# Patient Record
Sex: Male | Born: 1965 | Race: White | Hispanic: No | Marital: Single | Smoking: Current every day smoker
Health system: Southern US, Community
[De-identification: ages and names within clinical notes are randomized; demographics above are authoritative.]

## PROBLEM LIST (undated history)

## (undated) DIAGNOSIS — K56609 Unspecified intestinal obstruction, unspecified as to partial versus complete obstruction: Secondary | ICD-10-CM

## (undated) DIAGNOSIS — S069X9A Unspecified intracranial injury with loss of consciousness of unspecified duration, initial encounter: Secondary | ICD-10-CM

## (undated) DIAGNOSIS — K5652 Intestinal adhesions [bands] with complete obstruction: Secondary | ICD-10-CM

## (undated) DIAGNOSIS — F1010 Alcohol abuse, uncomplicated: Secondary | ICD-10-CM

## (undated) DIAGNOSIS — R569 Unspecified convulsions: Secondary | ICD-10-CM

## (undated) HISTORY — DX: Intestinal adhesions (bands) with complete obstruction: K56.52

## (undated) HISTORY — DX: Unspecified intestinal obstruction, unspecified as to partial versus complete obstruction: K56.609

---
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
## 2004-10-12 ENCOUNTER — Emergency Department: Payer: Self-pay | Admitting: Emergency Medicine

## 2005-01-03 ENCOUNTER — Emergency Department: Payer: Self-pay | Admitting: Emergency Medicine

## 2005-01-03 ENCOUNTER — Ambulatory Visit: Payer: Self-pay | Admitting: Emergency Medicine

## 2005-01-06 ENCOUNTER — Inpatient Hospital Stay: Payer: Self-pay | Admitting: General Surgery

## 2005-02-20 ENCOUNTER — Ambulatory Visit: Payer: Self-pay | Admitting: General Surgery

## 2006-07-01 ENCOUNTER — Ambulatory Visit: Payer: Self-pay | Admitting: Neurology

## 2007-07-03 ENCOUNTER — Emergency Department: Payer: Self-pay | Admitting: Unknown Physician Specialty

## 2018-01-29 ENCOUNTER — Inpatient Hospital Stay
Admission: EM | Admit: 2018-01-29 | Discharge: 2018-02-04 | DRG: 389 | Disposition: A | Discharge status: Home / Self Care | Payer: BLUE CROSS/BLUE SHIELD | Attending: Surgery | Admitting: Surgery

## 2018-01-29 ENCOUNTER — Emergency Department: Payer: BLUE CROSS/BLUE SHIELD

## 2018-01-29 ENCOUNTER — Encounter: Payer: Self-pay | Admitting: *Deleted

## 2018-01-29 ENCOUNTER — Other Ambulatory Visit: Payer: Self-pay

## 2018-01-29 DIAGNOSIS — N179 Acute kidney failure, unspecified: Secondary | ICD-10-CM | POA: Diagnosis present

## 2018-01-29 DIAGNOSIS — Z87820 Personal history of traumatic brain injury: Secondary | ICD-10-CM

## 2018-01-29 DIAGNOSIS — K56609 Unspecified intestinal obstruction, unspecified as to partial versus complete obstruction: Secondary | ICD-10-CM

## 2018-01-29 DIAGNOSIS — K565 Intestinal adhesions [bands], unspecified as to partial versus complete obstruction: Secondary | ICD-10-CM

## 2018-01-29 DIAGNOSIS — F17210 Nicotine dependence, cigarettes, uncomplicated: Secondary | ICD-10-CM | POA: Diagnosis present

## 2018-01-29 DIAGNOSIS — R933 Abnormal findings on diagnostic imaging of other parts of digestive tract: Secondary | ICD-10-CM | POA: Diagnosis not present

## 2018-01-29 DIAGNOSIS — K5652 Intestinal adhesions [bands] with complete obstruction: Secondary | ICD-10-CM

## 2018-01-29 DIAGNOSIS — K625 Hemorrhage of anus and rectum: Secondary | ICD-10-CM | POA: Diagnosis present

## 2018-01-29 DIAGNOSIS — Z978 Presence of other specified devices: Secondary | ICD-10-CM

## 2018-01-29 HISTORY — DX: Unspecified intestinal obstruction, unspecified as to partial versus complete obstruction: K56.609

## 2018-01-29 HISTORY — DX: Unspecified intracranial injury with loss of consciousness of unspecified duration, initial encounter: S06.9X9A

## 2018-01-29 LAB — COMPREHENSIVE METABOLIC PANEL WITH GFR
ALT: 27 U/L (ref 17–63)
AST: 34 U/L (ref 15–41)
Albumin: 5 g/dL (ref 3.5–5.0)
Alkaline Phosphatase: 95 U/L (ref 38–126)
Anion gap: 15 (ref 5–15)
BUN: 14 mg/dL (ref 6–20)
CO2: 26 mmol/L (ref 22–32)
Calcium: 9.9 mg/dL (ref 8.9–10.3)
Chloride: 99 mmol/L — ABNORMAL LOW (ref 101–111)
Creatinine, Ser: 0.93 mg/dL (ref 0.61–1.24)
GFR calc Af Amer: >60 mL/min
GFR calc non Af Amer: >60 mL/min
Glucose, Bld: 124 mg/dL — ABNORMAL HIGH (ref 65–99)
Potassium: 4.1 mmol/L (ref 3.5–5.1)
Sodium: 140 mmol/L (ref 135–145)
Total Bilirubin: 0.9 mg/dL (ref 0.3–1.2)
Total Protein: 9 g/dL — ABNORMAL HIGH (ref 6.5–8.1)

## 2018-01-29 LAB — URINALYSIS, COMPLETE (UACMP) WITH MICROSCOPIC
Bilirubin Urine: NEGATIVE
Glucose, UA: NEGATIVE mg/dL
Ketones, ur: NEGATIVE mg/dL
Leukocytes, UA: NEGATIVE
Nitrite: NEGATIVE
Protein, ur: 30 mg/dL — AB
Specific Gravity, Urine: 1.028 (ref 1.005–1.030)
Squamous Epithelial / HPF: NONE SEEN
pH: 6 (ref 5.0–8.0)

## 2018-01-29 LAB — CBC
HCT: 46.3 % (ref 40.0–52.0)
Hemoglobin: 15.3 g/dL (ref 13.0–18.0)
MCH: 30.3 pg (ref 26.0–34.0)
MCHC: 33 g/dL (ref 32.0–36.0)
MCV: 91.8 fL (ref 80.0–100.0)
Platelets: 309 K/uL (ref 150–440)
RBC: 5.04 MIL/uL (ref 4.40–5.90)
RDW: 14.3 % (ref 11.5–14.5)
WBC: 14 K/uL — ABNORMAL HIGH (ref 3.8–10.6)

## 2018-01-29 LAB — LIPASE, BLOOD: Lipase: 32 U/L (ref 11–51)

## 2018-01-29 MED ORDER — MORPHINE SULFATE (PF) 4 MG/ML IV SOLN
4.0000 mg | Freq: Once | INTRAVENOUS | Status: AC
  Administered 2018-01-29: 4 mg via INTRAVENOUS
  Filled 2018-01-29: qty 1

## 2018-01-29 MED ORDER — ENOXAPARIN SODIUM 40 MG/0.4ML ~~LOC~~ SOLN
40.0000 mg | SUBCUTANEOUS | Status: DC
  Administered 2018-01-29 – 2018-02-03 (×6): 40 mg via SUBCUTANEOUS
  Filled 2018-01-29 (×6): qty 0.4

## 2018-01-29 MED ORDER — ONDANSETRON HCL 4 MG/2ML IJ SOLN
4.0000 mg | Freq: Four times a day (QID) | INTRAMUSCULAR | Status: DC | PRN
  Administered 2018-01-30 – 2018-01-31 (×4): 4 mg via INTRAVENOUS
  Filled 2018-01-29 (×4): qty 2

## 2018-01-29 MED ORDER — MORPHINE SULFATE (PF) 2 MG/ML IV SOLN
2.0000 mg | INTRAVENOUS | Status: DC | PRN
  Administered 2018-01-30 – 2018-01-31 (×10): 2 mg via INTRAVENOUS
  Filled 2018-01-29 (×10): qty 1

## 2018-01-29 MED ORDER — PANTOPRAZOLE SODIUM 40 MG IV SOLR
40.0000 mg | Freq: Every day | INTRAVENOUS | Status: DC
  Administered 2018-01-29 – 2018-02-03 (×6): 40 mg via INTRAVENOUS
  Filled 2018-01-29 (×6): qty 40

## 2018-01-29 MED ORDER — IOPAMIDOL (ISOVUE-300) INJECTION 61%
100.0000 mL | Freq: Once | INTRAVENOUS | Status: AC | PRN
  Administered 2018-01-29: 100 mL via INTRAVENOUS

## 2018-01-29 MED ORDER — SODIUM CHLORIDE 0.9 % IV BOLUS (SEPSIS)
1000.0000 mL | Freq: Once | INTRAVENOUS | Status: AC
  Administered 2018-01-29: 1000 mL via INTRAVENOUS

## 2018-01-29 MED ORDER — PROMETHAZINE HCL 25 MG/ML IJ SOLN
12.5000 mg | Freq: Once | INTRAMUSCULAR | Status: AC
  Administered 2018-01-29: 12.5 mg via INTRAVENOUS
  Filled 2018-01-29: qty 1

## 2018-01-29 MED ORDER — IOPAMIDOL (ISOVUE-300) INJECTION 61%
30.0000 mL | Freq: Once | INTRAVENOUS | Status: AC | PRN
  Administered 2018-01-29: 30 mL via ORAL

## 2018-01-29 MED ORDER — KCL IN DEXTROSE-NACL 20-5-0.45 MEQ/L-%-% IV SOLN
INTRAVENOUS | Status: DC
  Administered 2018-01-29 – 2018-02-04 (×12): via INTRAVENOUS
  Filled 2018-01-29 (×20): qty 1000

## 2018-01-29 MED ORDER — ONDANSETRON HCL 4 MG/2ML IJ SOLN
4.0000 mg | Freq: Once | INTRAMUSCULAR | Status: AC
  Administered 2018-01-29: 4 mg via INTRAVENOUS
  Filled 2018-01-29: qty 2

## 2018-01-29 NOTE — Plan of Care (Signed)
  Progressing Education: Knowledge of General Education information will improve 01/29/2018 2123 - Progressing by Keonia Pasko, Alphonsus SiasKelly A, RN Pain Managment: General experience of comfort will improve 01/29/2018 2123 - Progressing by Adalea Handler, Alphonsus SiasKelly A, RN

## 2018-01-29 NOTE — ED Notes (Signed)
NG tube placed.

## 2018-01-29 NOTE — ED Provider Notes (Signed)
-----------------------------------------   4:28 PM on 01/29/2018 -----------------------------------------  I took over care on this patient from Dr. Alphonzo LemmingsMcShane.  Patient was pending CT abdomen to evaluate for possible SBO.  CT is in fact consistent with likely SBO.  I reassessed the patient, and he is reporting somewhat increased pain but appears relatively comfortable overall.  I consulted Dr. Earlene Plateravis from general surgery who recommends placing an NG tube, and he will come to evaluate the patient.  ----------------------------------------- 5:44 PM on 01/29/2018 -----------------------------------------  Dr. Earlene Plateravis has evaluated the patient, and will admit.   Dionne BucySiadecki, Lebaron Bautch, MD 01/29/18 1744
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
## 2018-01-29 NOTE — ED Notes (Signed)
Suction to low- intermittent on.
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
## 2018-01-29 NOTE — ED Triage Notes (Signed)
PT to ED reporting generalized abd pain with "bloating" that started last night at 20:00. Pain is cramping and nature. Pt reports nausea and vomiting with 6 episodes of emesis this morning. Pt also reports he has not had a BM in the past three days. No fevers reported.

## 2018-01-30 LAB — BASIC METABOLIC PANEL
ANION GAP: 11 (ref 5–15)
BUN: 11 mg/dL (ref 6–20)
CALCIUM: 8.9 mg/dL (ref 8.9–10.3)
CO2: 26 mmol/L (ref 22–32)
Chloride: 102 mmol/L (ref 101–111)
Creatinine, Ser: 0.93 mg/dL (ref 0.61–1.24)
Glucose, Bld: 120 mg/dL — ABNORMAL HIGH (ref 65–99)
Potassium: 3.6 mmol/L (ref 3.5–5.1)
Sodium: 139 mmol/L (ref 135–145)

## 2018-01-30 LAB — CBC
HCT: 40.8 % (ref 40.0–52.0)
HEMOGLOBIN: 13.8 g/dL (ref 13.0–18.0)
MCH: 31.2 pg (ref 26.0–34.0)
MCHC: 33.8 g/dL (ref 32.0–36.0)
MCV: 92.4 fL (ref 80.0–100.0)
Platelets: 248 10*3/uL (ref 150–440)
RBC: 4.41 MIL/uL (ref 4.40–5.90)
RDW: 14.3 % (ref 11.5–14.5)
WBC: 7.2 10*3/uL (ref 3.8–10.6)
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
## 2018-01-31 ENCOUNTER — Inpatient Hospital Stay: Payer: BLUE CROSS/BLUE SHIELD

## 2018-01-31 DIAGNOSIS — K56609 Unspecified intestinal obstruction, unspecified as to partial versus complete obstruction: Secondary | ICD-10-CM

## 2018-01-31 DIAGNOSIS — R933 Abnormal findings on diagnostic imaging of other parts of digestive tract: Secondary | ICD-10-CM

## 2018-01-31 LAB — BASIC METABOLIC PANEL
ANION GAP: 10 (ref 5–15)
BUN: 7 mg/dL (ref 6–20)
CALCIUM: 8.7 mg/dL — AB (ref 8.9–10.3)
CO2: 24 mmol/L (ref 22–32)
CREATININE: 0.68 mg/dL (ref 0.61–1.24)
Chloride: 101 mmol/L (ref 101–111)
Glucose, Bld: 118 mg/dL — ABNORMAL HIGH (ref 65–99)
Potassium: 3.7 mmol/L (ref 3.5–5.1)
Sodium: 135 mmol/L (ref 135–145)

## 2018-01-31 MED ORDER — KETOROLAC TROMETHAMINE 30 MG/ML IJ SOLN
30.0000 mg | Freq: Four times a day (QID) | INTRAMUSCULAR | Status: DC
  Administered 2018-01-31 – 2018-02-04 (×17): 30 mg via INTRAVENOUS
  Filled 2018-01-31 (×17): qty 1

## 2018-01-31 MED ORDER — PROMETHAZINE HCL 25 MG/ML IJ SOLN
12.5000 mg | Freq: Four times a day (QID) | INTRAMUSCULAR | Status: DC | PRN
  Administered 2018-01-31 – 2018-02-03 (×2): 12.5 mg via INTRAVENOUS
  Filled 2018-01-31 (×2): qty 1

## 2018-01-31 MED ORDER — DIATRIZOATE MEGLUMINE & SODIUM 66-10 % PO SOLN
90.0000 mL | Freq: Once | ORAL | Status: AC
  Administered 2018-01-31: 90 mL via NASOGASTRIC

## 2018-01-31 MED ORDER — PROCHLORPERAZINE EDISYLATE 5 MG/ML IJ SOLN
10.0000 mg | Freq: Four times a day (QID) | INTRAMUSCULAR | Status: DC | PRN
Start: 2018-01-31 — End: 2018-01-31
  Filled 2018-01-31: qty 2

## 2018-01-31 MED ORDER — HYDROMORPHONE HCL 1 MG/ML IJ SOLN
0.5000 mg | INTRAMUSCULAR | Status: DC | PRN
  Administered 2018-01-31 – 2018-02-02 (×8): 0.5 mg via INTRAVENOUS
  Filled 2018-01-31 (×9): qty 0.5

## 2018-01-31 NOTE — Progress Notes (Addendum)
01/31/2018  Subjective: Patient started having some more pain overnight.  Denies any nausea but does report feeling more bloated.  NG tube only had 350 out.  Vital signs: Temp:  [97.8 F (36.6 C)-98.5 F (36.9 C)] 97.8 F (36.6 C) (02/11 0624) Pulse Rate:  [81-89] 88 (02/11 0624) Resp:  [20] 20 (02/11 0624) BP: (109-130)/(71-81) 109/71 (02/11 0624) SpO2:  [98 %-99 %] 99 % (02/11 0624)   Intake/Output: 02/10 0701 - 02/11 0700 In: 1241 [I.V.:1241] Out: 980 [Urine:630; Emesis/NG output:350] Last BM Date: 01/29/18  Physical Exam: Constitutional: No acute distress Abdomen: Soft, mildly distended, with mild soreness to palpation in the mid abdomen.  No peritoneal signs.  Labs:  Recent Labs    01/29/18 1259 01/30/18 0448  WBC 14.0* 7.2  HGB 15.3 13.8  HCT 46.3 40.8  PLT 309 248   Recent Labs    01/30/18 0448 01/31/18 0438  NA 139 135  K 3.6 3.7  CL 102 101  CO2 26 24  GLUCOSE 120* 118*  BUN 11 7  CREATININE 0.93 0.68  CALCIUM 8.9 8.7*   No results for input(s): LABPROT, INR in the last 72 hours.  Imaging: No results found.  Assessment/Plan: 52 year old male with small bowel obstruction.  -We will obtain KUB this morning to evaluate his obstruction and possible issues with pain.  He may just need a larger caliber NG tube is the one that he has is likely a pediatric NG tube placed in the emergency department instead of a regular adult size -Change pain medications to start Toradol. -We will add Phenergan for nausea as well.   Mike IllJose Luis Torell Minder, MD [facility]
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
## 2018-01-31 NOTE — Progress Notes (Signed)
Patient complaining of nausea, too soon for prn Zofran. Dr Sheryle Hailiamond notified. He has ordered Compazine prn. Will monitor patient.

## 2018-02-01 ENCOUNTER — Inpatient Hospital Stay: Payer: BLUE CROSS/BLUE SHIELD

## 2018-02-01 LAB — BASIC METABOLIC PANEL
ANION GAP: 13 (ref 5–15)
BUN: 21 mg/dL — ABNORMAL HIGH (ref 6–20)
CALCIUM: 9.6 mg/dL (ref 8.9–10.3)
CHLORIDE: 95 mmol/L — AB (ref 101–111)
CO2: 26 mmol/L (ref 22–32)
Creatinine, Ser: 1.23 mg/dL (ref 0.61–1.24)
GFR calc Af Amer: >60 mL/min (ref >60)
GFR calc non Af Amer: >60 mL/min (ref >60)
Glucose, Bld: 115 mg/dL — ABNORMAL HIGH (ref 65–99)
POTASSIUM: 4 mmol/L (ref 3.5–5.1)
Sodium: 134 mmol/L — ABNORMAL LOW (ref 135–145)

## 2018-02-01 LAB — HIV ANTIBODY (ROUTINE TESTING W REFLEX): HIV SCREEN 4TH GENERATION: NONREACTIVE

## 2018-02-01 MED ORDER — PROCHLORPERAZINE EDISYLATE 5 MG/ML IJ SOLN
5.0000 mg | Freq: Four times a day (QID) | INTRAMUSCULAR | Status: DC | PRN
  Administered 2018-02-01: 5 mg via INTRAVENOUS
  Filled 2018-02-01 (×2): qty 1

## 2018-02-01 NOTE — Progress Notes (Signed)
02/01/2018  Subjective: Patient had nausea and episode of emesis last night despite of the NG tube that was in place.  This was a likely pediatric size NG tube as it was a very thin caliber.  He did have a Gastrografin challenge yesterday which she has tolerated well.  KUB this morning is still pending.  After the episode of emesis, the NG tube was exchanged for a 16 [place] NG tube and immediately 2 L plus of gastric contents were suctioned out.  The patient this morning reports that he is feeling significantly better with much improved pain and discomfort and distention.  Still no flatus or bowel movement.  However he does feel some gurgling in his abdomen.  Vital signs: Temp:  [98 F (36.7 C)-98.6 F (37 C)] 98.6 F (37 C) (02/12 0412) Pulse Rate:  [95-114] 114 (02/12 0412) Resp:  [16-22] 22 (02/12 0412) BP: (111-135)/(76-88) 111/85 (02/12 0412) SpO2:  [100 %] 100 % (02/12 0412)   Intake/Output: 02/11 0701 - 02/12 0700 In: 2560 [I.V.:2560] Out: 3825 [Urine:125; Emesis/NG output:3700] Last BM Date: 01/29/18  Physical Exam: Constitutional: No acute distress Abdomen: Softer, less distended, with no tenderness to palpation.  Midline scar well-healed consistent with prior laparotomy.  Labs:  Recent Labs    01/29/18 1259 01/30/18 0448  WBC 14.0* 7.2  HGB 15.3 13.8  HCT 46.3 40.8  PLT 309 248   Recent Labs    01/31/18 0438 02/01/18 0503  NA 135 134*  K 3.7 4.0  CL 101 95*  CO2 24 26  GLUCOSE 118* 115*  BUN 7 21*  CREATININE 0.68 1.23  CALCIUM 8.7* 9.6   No results for input(s): LABPROT, INR in the last 72 hours.  Imaging: Dg Abd 1 View  Result Date: 02/01/2018 CLINICAL DATA:  Encounter for orogastric tube placement. EXAM: ABDOMEN - 1 VIEW COMPARISON:  02/01/2018 CT 0119 hours FINDINGS: The tip and side port of a gastric tube are noted in the expected location of the stomach. Scattered air containing small large bowel loops are noted. No free air is identified. The  included heart and lung bases are nonacute. There is minimal atelectasis at the left lung base. No acute nor suspicious osseous abnormalities. IMPRESSION: Satisfactory gastric tube position in the left upper quadrant of the abdomen. Electronically Signed   By: Tollie Ethavid  Kwon M.D.   On: 02/01/2018 02:41   Dg Abd 1 View  Result Date: 02/01/2018 CLINICAL DATA:  52 y/o  M; nasogastric tube placement EXAM: ABDOMEN - 1 VIEW COMPARISON:  01/31/2018 abdomen radiographs FINDINGS: Nasogastric tube coils upon itself in the lower esophagus with tip projecting over the mid esophagus. Persistent mildly distended colon. Possible small right pleural effusion. Bones are unremarkable. IMPRESSION: Nasogastric tube coils in lower esophagus with tip projecting over mid esophagus, readjustment is recommended. These results will be called to the ordering clinician or representative by the Radiologist Assistant, and communication documented in the PACS or zVision Dashboard. Electronically Signed   By: Mitzi HansenLance  Furusawa-Stratton M.D.   On: 02/01/2018 01:42   Dg Abd 1 View  Result Date: 01/31/2018 CLINICAL DATA:  Persistent abdominal distention and nausea. Admitted 2 days ago with small bowel obstruction. EXAM: ABDOMEN - 1 VIEW COMPARISON:  Radiographs and CT 01/29/2018. FINDINGS: Nasogastric tube extends to the mid gastric body. Several mildly dilated loops small bowel again noted in the mid abdomen. There is slightly increased colonic gas. No evidence of free intraperitoneal air. Posttraumatic deformities of the left pelvic bones appear unchanged. IMPRESSION:  Slightly increased colonic gas with persistent asymmetric small bowel distension, consistent with improving partial small bowel obstruction. Electronically Signed   By: Carey Bullocks M.D.   On: 01/31/2018 10:19    Assessment/Plan: 52 year old male with small bowel obstruction.  -Patient feels now much better with the new NG tube that working bigger caliber.  I think at this  point probably we would reset the clock on his NG tube conservative management given that this tube is actually working.  KUB from Gastrografin challenge still pending. -Continue n.p.o., IV fluid hydration, and appropriate nausea pain control.   [name], MD [facility]

## 2018-02-01 NOTE — Progress Notes (Signed)
Patient complaining of nausea, had on episode of emesis. It is too soon for the next dose of Zofran and Phenergan. Notified Dr Everlene FarrierPabon and he has ordered Compazine 5mg  IV. Will administer and continue to monitor patient.

## 2018-02-01 NOTE — Plan of Care (Signed)
Pt continues to have output through the NG tube without complications of nausea. Pts pain has been minimal

## 2018-02-02 ENCOUNTER — Inpatient Hospital Stay: Payer: BLUE CROSS/BLUE SHIELD

## 2018-02-02 DIAGNOSIS — K5652 Intestinal adhesions [bands] with complete obstruction: Principal | ICD-10-CM

## 2018-02-02 LAB — CBC
HEMATOCRIT: 46.7 % (ref 40.0–52.0)
Hemoglobin: 15.6 g/dL (ref 13.0–18.0)
MCH: 30.6 pg (ref 26.0–34.0)
MCHC: 33.5 g/dL (ref 32.0–36.0)
MCV: 91.3 fL (ref 80.0–100.0)
PLATELETS: 304 10*3/uL (ref 150–440)
RBC: 5.12 MIL/uL (ref 4.40–5.90)
RDW: 13.8 % (ref 11.5–14.5)
WBC: 4.9 10*3/uL (ref 3.8–10.6)

## 2018-02-02 LAB — MAGNESIUM: MAGNESIUM: 2.2 mg/dL (ref 1.7–2.4)

## 2018-02-02 LAB — BASIC METABOLIC PANEL
ANION GAP: 14 (ref 5–15)
BUN: 41 mg/dL — ABNORMAL HIGH (ref 6–20)
CALCIUM: 8.9 mg/dL (ref 8.9–10.3)
CHLORIDE: 96 mmol/L — AB (ref 101–111)
CO2: 23 mmol/L (ref 22–32)
CREATININE: 1.73 mg/dL — AB (ref 0.61–1.24)
GFR calc non Af Amer: 44 mL/min — ABNORMAL LOW (ref >60)
GFR, EST AFRICAN AMERICAN: 51 mL/min — AB (ref >60)
Glucose, Bld: 118 mg/dL — ABNORMAL HIGH (ref 65–99)
Potassium: 3.9 mmol/L (ref 3.5–5.1)
Sodium: 133 mmol/L — ABNORMAL LOW (ref 135–145)
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
## 2018-02-02 NOTE — Plan of Care (Signed)
Pt is progressing. Output from NG has decreased. No nausea and minimal pain

## 2018-02-03 ENCOUNTER — Inpatient Hospital Stay: Payer: BLUE CROSS/BLUE SHIELD

## 2018-02-03 LAB — CBC
HCT: 38 % — ABNORMAL LOW (ref 40.0–52.0)
Hemoglobin: 12.7 g/dL — ABNORMAL LOW (ref 13.0–18.0)
MCH: 30.6 pg (ref 26.0–34.0)
MCHC: 33.3 g/dL (ref 32.0–36.0)
MCV: 91.9 fL (ref 80.0–100.0)
PLATELETS: 230 10*3/uL (ref 150–440)
RBC: 4.14 MIL/uL — ABNORMAL LOW (ref 4.40–5.90)
RDW: 13.7 % (ref 11.5–14.5)
WBC: 6.4 10*3/uL (ref 3.8–10.6)
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
## 2018-02-03 NOTE — Progress Notes (Signed)
  Afternoon rounds.  Patient tolerated clamp trial without any return of pain or nausea.  Minimal to no output when returned to suction.  Abdomen is benign.  NG tube removed per my order.  Discussed with the patient that he can have clear liquids but he is to sip on them to go slow.  Encourage ambulation and incentive spirometer use  Mike Frameharles Zarin Knupp, MD [facility] General Surgeon [facility]  Day [facility] [phone](7a-7p) [phone] Night ASCOM [phone](7p-7a) [phone]

## 2018-02-03 NOTE — Progress Notes (Signed)
NGT removed per MD order.
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
## 2018-02-03 NOTE — Plan of Care (Signed)
Pt has tolerated his NGT being clamped well. No C/O nausea/pain.

## 2018-02-03 NOTE — Progress Notes (Signed)
Melodie Bouillon, MD [address], [place], [place], [address] [address], [place], [place], [address] Phone: [phone]  Fax: [phone]   Subjective:  Pt. On NG clamp trial today and tolerating it well without any nausea vomiting.  Continuing to have some erections with no bowel movements.  Denies any abdominal pain.  Objective: Vital signs in last 24 hours: Vitals:   02/02/18 1315 02/02/18 2128 02/03/18 0618 02/03/18 1112  BP: 96/83 108/74 103/66   Pulse: 90 73 65   Resp: 20 18 20    Temp: 97.8 F (36.6 C) 97.7 F (36.5 C) (!) 97.5 F (36.4 C)   TempSrc: Oral Oral Oral   SpO2: 98% 100% 97%   Weight:    158 lb 4.6 oz (71.8 kg)  Height:       Weight change:   Intake/Output Summary (Last 24 hours) at 02/03/2018 1221 Last data filed at 02/03/2018 1153 Gross per 24 hour  Intake 2953 ml  Output 1105 ml  Net 1848 ml     Exam: Cardiac: +S1, +S2, RRR, No edema Pulm: CTA b/l, Normal Resp Effort Abd: Soft, NT/ND, No HSM Skin: Warm, no rashes Neck: Supple, Trachea midline   Lab Results: Lab Results  Component Value Date   WBC 6.4 02/03/2018   HGB 12.7 (L) 02/03/2018   HCT 38.0 (L) 02/03/2018   MCV 91.9 02/03/2018   PLT 230 02/03/2018   Micro Results: No results found for this or any previous visit (from the past 240 hour(s)). Studies/Results: Dg Abd Acute W/chest  Result Date: 02/02/2018 CLINICAL DATA:  Follow-up small bowel obstruction EXAM: DG ABDOMEN ACUTE W/ 1V CHEST COMPARISON:  02/01/2018 abdominal radiograph FINDINGS: Enteric tube terminates in the proximal stomach. Normal heart size. Normal mediastinal contour. No pneumothorax. No pleural effusion. Lungs appear clear, with no acute consolidative airspace disease and no pulmonary edema. There are persistent mildly to moderately dilated small bowel loops with air-fluid levels throughout the abdomen measuring up to 5.1 cm diameter, which appears slightly worsened in the interval. No  evidence of pneumatosis or pneumoperitoneum. No radiopaque nephrolithiasis. Retained cerclage wire overlies the left iliac bone medially. IMPRESSION: 1. No active disease in the chest. 2. Persistent mildly to moderately dilated small bowel loops with air-fluid levels throughout the abdomen, slightly worsened, compatible with persistent distal small bowel obstruction. Electronically Signed   By: Delbert Phenix M.D.   On: 02/02/2018 08:27   Dg Abd Portable 1v  Result Date: 02/03/2018 CLINICAL DATA:  Small-bowel obstruction secondary to adhesions. EXAM: PORTABLE ABDOMEN - 1 VIEW COMPARISON:  02/02/2018. FINDINGS: NG tube noted in stable position over the stomach. Surgical clips and wiring noted over the left abdomen. Multiple dilated loops of small bowel are again noted consistent with small bowel obstruction. Stool noted throughout the colon. No acute bony abnormality. IMPRESSION: 1.  NG tube noted over the stomach. 2. Multiple dilated loops of small bowel are noted again consistent with small bowel obstruction. Similar findings on prior exam. Electronically Signed   By: Maisie Fus  Register   On: 02/03/2018 05:48   Medications:  Scheduled Meds: . enoxaparin (LOVENOX) injection  40 mg Subcutaneous Q24H  . ketorolac  30 mg Intravenous Q6H  . pantoprazole (PROTONIX) IV  40 mg Intravenous QHS   Continuous Infusions: . dextrose 5 % and 0.45 % NaCl with KCl 20 mEq/L 125 mL/hr at 02/02/18 2329   PRN Meds:.HYDROmorphone (DILAUDID) injection, ondansetron (ZOFRAN) IV, prochlorperazine, promethazine   Assessment: Active Problems:   Small  bowel obstruction (HCC)    Plan: Surgical team managing patient for small bowel obstruction with NG tube, and on clamp trial today, and patient tolerating it well Once small bowel obstruction is clinically resolved, can schedule for colonoscopy to evaluate abnormal CT finding, and since he has never had a screening colonoscopy before Can schedule this as an  outpatient Follow-up in GI clinic on discharge at which time colonoscopy can be scheduled Continue to avoid constipating medications   LOS: 5 days   Melodie BouillonVarnita Kristy Catoe, MD 02/03/2018, 12:21 PM

## 2018-02-04 LAB — BASIC METABOLIC PANEL
ANION GAP: 8 (ref 5–15)
BUN: 9 mg/dL (ref 6–20)
CHLORIDE: 106 mmol/L (ref 101–111)
CO2: 21 mmol/L — AB (ref 22–32)
Calcium: 8.5 mg/dL — ABNORMAL LOW (ref 8.9–10.3)
Creatinine, Ser: 0.7 mg/dL (ref 0.61–1.24)
GFR calc Af Amer: >60 mL/min (ref >60)
GLUCOSE: 101 mg/dL — AB (ref 65–99)
POTASSIUM: 4.1 mmol/L (ref 3.5–5.1)
Sodium: 135 mmol/L (ref 135–145)

## 2018-02-04 NOTE — Progress Notes (Signed)
IV was removed. Discharge instructions and follow-up appointments were provided to the pt and father at bedside. All questions answered. The pt refused a wheelchair and walked downstairs with his father.

## 2018-02-04 NOTE — Discharge Summary (Signed)
Patient ID: Vivia EwingSteven D Stumpo MRN: [id number] DOB/AGE: [birth year] 52 y.o.  Admit date: 01/29/2018 Discharge date: 02/04/2018  Discharge Diagnoses:  Small bowel obstruction  Procedures Performed: None  Discharged Condition: good  Hospital Course: Patient admitted with a small bowel obstruction. Resolved with NGT decompression. ON day of discharge his abdomen was soft and nontender. He was tolerating a diet and having bowel function.  Discharge Orders: Discharge Instructions    Call MD for:  persistant nausea and vomiting   Complete by:  As directed    Call MD for:  severe uncontrolled pain   Complete by:  As directed    Call MD for:  temperature >100.4   Complete by:  As directed    Diet - low sodium heart healthy   Complete by:  As directed    Increase activity slowly   Complete by:  As directed       Disposition:   Discharge Medications: Allergies as of 02/04/2018   Not on File     Medication List    You have not been prescribed any medications.      Follwup: Follow-up Information    [facility] [place]. Schedule an appointment as soon as possible for a visit in 2 week(s).   Specialty:  General Surgery Why:  Hospital follow up Contact information: [address] [place] [phone]          Signed: Ricarda FrameCharles Jiles Goya 02/04/2018, 11:28 AM

## 2018-02-04 NOTE — Discharge Instructions (Signed)
Small Bowel Obstruction °A small bowel obstruction means that something is blocking the small bowel. The small bowel is also called the small intestine. It is the long tube that connects the stomach to the colon. An obstruction will stop food and fluids from passing through the small bowel. Treatment depends on what is causing the problem and how bad the problem is. °Follow these instructions at home: °· Get a lot of rest. °· Follow your diet as told by your doctor. You may need to: °? Only drink clear liquids until you start to get better. °? Avoid solid foods as told by your doctor. °· Take over-the-counter and prescription medicines only as told by your doctor. °· Keep all follow-up visits as told by your doctor. This is important. °Contact a doctor if: °· You have a fever. °· You have chills. °Get help right away if: °· You have pain or cramps that get worse. °· You throw up (vomit) blood. °· You have a feeling of being sick to your stomach (nausea) that does not go away. °· You cannot stop throwing up. °· You cannot drink fluids. °· You feel confused. °· You feel dry or thirsty (dehydrated). °· Your belly gets more bloated. °· You feel weak or you pass out (faint). °This information is not intended to replace advice given to you by your health care provider. Make sure you discuss any questions you have with your health care provider. °Document Released: 01/14/2005 Document Revised: 08/03/2016 Document Reviewed: 01/31/2015 °Elsevier Interactive Patient Education © 2018 [employer] ° °

## 2018-02-07 ENCOUNTER — Telehealth: Payer: Self-pay | Admitting: General Practice

## 2018-02-07 NOTE — Telephone Encounter (Signed)
Patient came by the office and dropped off some fmla paperwork, payment has been paid and paperwork placed in folder up front.

## 2018-02-08 DIAGNOSIS — K5652 Intestinal adhesions [bands] with complete obstruction: Secondary | ICD-10-CM

## 2018-02-09 NOTE — Telephone Encounter (Signed)
Patient came by and picked up his copy.

## 2018-02-14 ENCOUNTER — Telehealth: Payer: Self-pay | Admitting: Gastroenterology

## 2018-02-14 ENCOUNTER — Encounter: Payer: Self-pay | Admitting: Gastroenterology

## 2018-02-14 NOTE — Telephone Encounter (Signed)
LVM for patient to call and schedule a one week follow up with Dr. Maximino Greenlandahiliani

## 2018-02-14 NOTE — Telephone Encounter (Signed)
Letter sent for patient to call and schedule a 1 week follow up with Dr. Maximino Greenlandahiliani.

## 2018-02-16 ENCOUNTER — Emergency Department: Payer: BLUE CROSS/BLUE SHIELD

## 2018-02-16 ENCOUNTER — Other Ambulatory Visit: Payer: Self-pay

## 2018-02-16 ENCOUNTER — Inpatient Hospital Stay: Payer: BLUE CROSS/BLUE SHIELD

## 2018-02-16 ENCOUNTER — Inpatient Hospital Stay
Admission: EM | Admit: 2018-02-16 | Discharge: 2018-02-26 | DRG: 330 | Disposition: A | Discharge status: Home Health | Payer: BLUE CROSS/BLUE SHIELD | Attending: General Surgery | Admitting: General Surgery

## 2018-02-16 DIAGNOSIS — K660 Peritoneal adhesions (postprocedural) (postinfection): Principal | ICD-10-CM | POA: Diagnosis present

## 2018-02-16 DIAGNOSIS — K9189 Other postprocedural complications and disorders of digestive system: Secondary | ICD-10-CM

## 2018-02-16 DIAGNOSIS — R000 Tachycardia, unspecified: Secondary | ICD-10-CM | POA: Diagnosis present

## 2018-02-16 DIAGNOSIS — K219 Gastro-esophageal reflux disease without esophagitis: Secondary | ICD-10-CM | POA: Diagnosis present

## 2018-02-16 DIAGNOSIS — K567 Ileus, unspecified: Secondary | ICD-10-CM

## 2018-02-16 DIAGNOSIS — Z0189 Encounter for other specified special examinations: Secondary | ICD-10-CM

## 2018-02-16 DIAGNOSIS — E860 Dehydration: Secondary | ICD-10-CM | POA: Diagnosis present

## 2018-02-16 DIAGNOSIS — F17210 Nicotine dependence, cigarettes, uncomplicated: Secondary | ICD-10-CM | POA: Diagnosis present

## 2018-02-16 DIAGNOSIS — Z87820 Personal history of traumatic brain injury: Secondary | ICD-10-CM

## 2018-02-16 DIAGNOSIS — T8149XA Infection following a procedure, other surgical site, initial encounter: Secondary | ICD-10-CM | POA: Diagnosis not present

## 2018-02-16 DIAGNOSIS — K56609 Unspecified intestinal obstruction, unspecified as to partial versus complete obstruction: Secondary | ICD-10-CM | POA: Diagnosis present

## 2018-02-16 LAB — COMPREHENSIVE METABOLIC PANEL
ALT: 25 U/L (ref 17–63)
ANION GAP: 10 (ref 5–15)
AST: 33 U/L (ref 15–41)
Albumin: 4 g/dL (ref 3.5–5.0)
Alkaline Phosphatase: 71 U/L (ref 38–126)
BUN: 10 mg/dL (ref 6–20)
CALCIUM: 9.3 mg/dL (ref 8.9–10.3)
CHLORIDE: 106 mmol/L (ref 101–111)
CO2: 23 mmol/L (ref 22–32)
Creatinine, Ser: 0.86 mg/dL (ref 0.61–1.24)
GFR calc Af Amer: >60 mL/min (ref >60)
Glucose, Bld: 122 mg/dL — ABNORMAL HIGH (ref 65–99)
Potassium: 3.9 mmol/L (ref 3.5–5.1)
SODIUM: 139 mmol/L (ref 135–145)
Total Bilirubin: 0.8 mg/dL (ref 0.3–1.2)
Total Protein: 7.5 g/dL (ref 6.5–8.1)

## 2018-02-16 LAB — CBC WITH DIFFERENTIAL/PLATELET
Basophils Absolute: 0.1 10*3/uL (ref 0–0.1)
Basophils Relative: 1 %
EOS ABS: 0 10*3/uL (ref 0–0.7)
EOS PCT: 0 %
HCT: 43.8 % (ref 40.0–52.0)
Hemoglobin: 14.4 g/dL (ref 13.0–18.0)
LYMPHS ABS: 0.8 10*3/uL — AB (ref 1.0–3.6)
Lymphocytes Relative: 6 %
MCH: 29.9 pg (ref 26.0–34.0)
MCHC: 32.8 g/dL (ref 32.0–36.0)
MCV: 91 fL (ref 80.0–100.0)
MONO ABS: 0.6 10*3/uL (ref 0.2–1.0)
MONOS PCT: 4 %
Neutro Abs: 12.3 10*3/uL — ABNORMAL HIGH (ref 1.4–6.5)
Neutrophils Relative %: 89 %
PLATELETS: 438 10*3/uL (ref 150–440)
RBC: 4.82 MIL/uL (ref 4.40–5.90)
RDW: 14.3 % (ref 11.5–14.5)
WBC: 13.9 10*3/uL — ABNORMAL HIGH (ref 3.8–10.6)

## 2018-02-16 LAB — LIPASE, BLOOD: LIPASE: 28 U/L (ref 11–51)

## 2018-02-16 MED ORDER — DEXTROSE IN LACTATED RINGERS 5 % IV SOLN
INTRAVENOUS | Status: DC
  Administered 2018-02-16 – 2018-02-17 (×5): via INTRAVENOUS

## 2018-02-16 MED ORDER — PROMETHAZINE HCL 25 MG/ML IJ SOLN
12.5000 mg | Freq: Four times a day (QID) | INTRAMUSCULAR | Status: DC | PRN
  Administered 2018-02-16 – 2018-02-25 (×3): 12.5 mg via INTRAVENOUS
  Filled 2018-02-16 (×3): qty 1

## 2018-02-16 MED ORDER — PANTOPRAZOLE SODIUM 40 MG IV SOLR
40.0000 mg | Freq: Every day | INTRAVENOUS | Status: DC
  Administered 2018-02-16 – 2018-02-24 (×8): 40 mg via INTRAVENOUS
  Filled 2018-02-16 (×9): qty 40

## 2018-02-16 MED ORDER — ONDANSETRON HCL 4 MG/2ML IJ SOLN
INTRAMUSCULAR | Status: AC
  Administered 2018-02-16: 4 mg via INTRAVENOUS
  Filled 2018-02-16: qty 2

## 2018-02-16 MED ORDER — HYDROMORPHONE HCL 1 MG/ML IJ SOLN
0.5000 mg | INTRAMUSCULAR | Status: DC | PRN
Start: 2018-02-16 — End: 2018-02-17
  Administered 2018-02-17 (×3): 0.5 mg via INTRAVENOUS
  Filled 2018-02-16 (×3): qty 0.5

## 2018-02-16 MED ORDER — ONDANSETRON 4 MG PO TBDP: 4.0000 mg | ORAL_TABLET | Freq: Four times a day (QID) | ORAL | Status: DC | PRN

## 2018-02-16 MED ORDER — SODIUM CHLORIDE 0.9 % IV BOLUS (SEPSIS)
1000.0000 mL | INTRAVENOUS | Status: AC
  Administered 2018-02-16: 1000 mL via INTRAVENOUS

## 2018-02-16 MED ORDER — ONDANSETRON HCL 4 MG/2ML IJ SOLN
4.0000 mg | Freq: Once | INTRAMUSCULAR | Status: AC
  Administered 2018-02-16: 4 mg via INTRAVENOUS

## 2018-02-16 MED ORDER — DIPHENHYDRAMINE HCL 25 MG PO CAPS: 25.0000 mg | ORAL_CAPSULE | Freq: Four times a day (QID) | ORAL | Status: DC | PRN

## 2018-02-16 MED ORDER — ONDANSETRON HCL 4 MG/2ML IJ SOLN
4.0000 mg | Freq: Four times a day (QID) | INTRAMUSCULAR | Status: DC | PRN
  Administered 2018-02-16: 4 mg via INTRAVENOUS
  Filled 2018-02-16: qty 2

## 2018-02-16 MED ORDER — LIDOCAINE HCL 2 % EX GEL
CUTANEOUS | Status: AC
  Filled 2018-02-16: qty 10

## 2018-02-16 MED ORDER — ONDANSETRON HCL 4 MG/2ML IJ SOLN
4.0000 mg | INTRAMUSCULAR | Status: AC
  Administered 2018-02-16: 4 mg via INTRAVENOUS
  Filled 2018-02-16: qty 2

## 2018-02-16 MED ORDER — INFLUENZA VAC SPLIT QUAD 0.5 ML IM SUSY
0.5000 mL | PREFILLED_SYRINGE | INTRAMUSCULAR | Status: DC
  Filled 2018-02-16: qty 0.5

## 2018-02-16 MED ORDER — DIPHENHYDRAMINE HCL 50 MG/ML IJ SOLN: 25.0000 mg | Freq: Four times a day (QID) | INTRAMUSCULAR | Status: DC | PRN

## 2018-02-16 MED ORDER — MORPHINE SULFATE (PF) 4 MG/ML IV SOLN
4.0000 mg | INTRAVENOUS | Status: DC | PRN
  Administered 2018-02-16: 4 mg via INTRAVENOUS
  Filled 2018-02-16: qty 1

## 2018-02-16 MED ORDER — HYDRALAZINE HCL 20 MG/ML IJ SOLN: 10.0000 mg | INTRAMUSCULAR | Status: DC | PRN

## 2018-02-16 MED ORDER — KETOROLAC TROMETHAMINE 30 MG/ML IJ SOLN
30.0000 mg | Freq: Four times a day (QID) | INTRAMUSCULAR | Status: AC
  Administered 2018-02-16 – 2018-02-21 (×19): 30 mg via INTRAVENOUS
  Filled 2018-02-16 (×19): qty 1

## 2018-02-16 MED ORDER — MORPHINE SULFATE (PF) 4 MG/ML IV SOLN
4.0000 mg | Freq: Once | INTRAVENOUS | Status: AC
  Administered 2018-02-16: 4 mg via INTRAVENOUS
  Filled 2018-02-16: qty 1

## 2018-02-16 MED ORDER — IOPAMIDOL (ISOVUE-300) INJECTION 61%
100.0000 mL | Freq: Once | INTRAVENOUS | Status: AC | PRN
  Administered 2018-02-16: 100 mL via INTRAVENOUS

## 2018-02-16 NOTE — ED Triage Notes (Signed)
Pt in with co generalized abd pain since 2030 with vomiting since 0130. Pt has recent admission for bowel obstruction states feels the same.
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
## 2018-02-16 NOTE — ED Notes (Signed)
Patient has had approx drainage into suction canister, continuing to drain.

## 2018-02-16 NOTE — ED Notes (Signed)
This tech to transport patient to room 224. Pt belongings (shirt and coat) bagged and placed on bed with pt.

## 2018-02-16 NOTE — ED Notes (Signed)
Report given to Megan, RN.

## 2018-02-16 NOTE — ED Notes (Signed)
This RN to bedside to introduce self to patient. Pt resting in bed alert and oriented. NAD noted at this time. NG tube remains hooked up to low intermittent suction at this time. Pt denies any further needs. Will continue to monitor for further patient needs.
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
## 2018-02-16 NOTE — Progress Notes (Signed)
02/16/2018  Subjective: Patient admitted this morning by Dr. Tonita Cong for recurrent small bowel obstruction.  He was hospitalized for same on 2/9 to 2/15.  Feels better with NG tube in place and reports feeling less pressure.  Vital signs: Temp:  [97.4 F (36.3 C)-97.8 F (36.6 C)] 97.8 F (36.6 C) (02/27 0810) Pulse Rate:  [76-91] 84 (02/27 0810) Resp:  [12-18] 12 (02/27 0810) BP: (128-138)/(85-93) 137/85 (02/27 0810) SpO2:  [98 %-100 %] 100 % (02/27 0810) Weight:  [70.5 kg (155 lb 6.8 oz)-81.6 kg (180 lb)] 70.5 kg (155 lb 6.8 oz) (02/27 0844)   Intake/Output: No intake/output data recorded.    Physical Exam: Constitutional: No acute distress Abdomen:  Soft, mildly distended, with tenderness to palpation in the midabdomen, more around his midline incision.  Labs:  Recent Labs    02/16/18 0528  WBC 13.9*  HGB 14.4  HCT 43.8  PLT 438   Recent Labs    02/16/18 0528  NA 139  K 3.9  CL 106  CO2 23  GLUCOSE 122*  BUN 10  CREATININE 0.86  CALCIUM 9.3   No results for input(s): LABPROT, INR in the last 72 hours.  Imaging: Ct Abdomen Pelvis W Contrast  Result Date: 02/16/2018 CLINICAL DATA:  52 year old male with nausea vomiting. History of bowel obstruction and prior exploratory laparotomy. EXAM: CT ABDOMEN AND PELVIS WITH CONTRAST TECHNIQUE: Multidetector CT imaging of the abdomen and pelvis was performed using the standard protocol following bolus administration of intravenous contrast. CONTRAST:  ISOVUE-300 IOPAMIDOL (ISOVUE-300) INJECTION 61% COMPARISON:  Abdominal CT dated 01/29/2018 FINDINGS: Lower chest: Clusters of nodular density in the lower lobes bilaterally, new compared to the prior CT most likely infectious or inflammatory. Aspiration is not excluded. Clinical correlation is recommended. No intra-abdominal free air. Small amount of fluid within the pelvis and adjacent to the liver. Hepatobiliary: No focal liver abnormality is seen. No gallstones,  gallbladder wall thickening, or biliary dilatation. Pancreas: Unremarkable. No pancreatic ductal dilatation or surrounding inflammatory changes. Spleen: Normal in size without focal abnormality. Adrenals/Urinary Tract: Adrenal glands are unremarkable. Kidneys are normal, without renal calculi, focal lesion, or hydronephrosis. Bladder is unremarkable. Stomach/Bowel: An enteric tube is seen with tip in the gastric fundus. There is a small hiatal hernia. There is dilatation of multiple loops of small bowel in the proximal and mid abdomen measuring up to 3.3 cm in diameter. The distal small bowel demonstrate a normal caliber. A transition zone is seen in the anterior lower pelvis (series 2, image 68 and coronal series 5 image 25). There is abutment of loops of small bowel to the anterior peritoneal wall and rectus sheath most consistent with adhesions. There is mild associated inflammatory changes of the bowel loops in the left lower abdomen. The colon is unremarkable. An air-filled diverticula or prominent colonic fold arising from the sigmoid colon corresponds to the location of the previously seen ovoid lesion. The appendix is normal. Vascular/Lymphatic: The abdominal aorta and IVC appear unremarkable. No portal venous gas. There is no adenopathy. Reproductive: The prostate and seminal vesicles are grossly unremarkable. Other: Midline vertical anterior abdominal wall incisional scar. Musculoskeletal: Old healed fracture of the left pelvic bone with partial ankylosis of the left SI joint. No acute osseous pathology. IMPRESSION: Small-bowel obstruction secondary to adhesions with transition in the left anterior lower pelvis posterior to the left rectus sheath. Overall there is progression of the obstruction compared to the prior CT of 01/29/2018. Electronically Signed   By: Elgie Collard  M.D.   On: 02/16/2018 06:48   Dg Abd Portable 1 View  Result Date: 02/16/2018 CLINICAL DATA:  52 y/o  M; enteric tube  placement. EXAM: PORTABLE ABDOMEN - 1 VIEW COMPARISON:  02/03/2018 abdomen radiographs. FINDINGS: Normal bowel gas pattern. Enteric tube tip projects over gastric body. Bones are unremarkable. IMPRESSION: Enteric tube tip projects over gastric body. Electronically Signed   By: Mitzi HansenLance  Furusawa-Stratton M.D.   On: 02/16/2018 06:06    Assessment/Plan: 52 yo male with small bowel obstruction  --discussed with patient that we would start conservative management for now with NPO and NG tube to suction as planned and discussed with Dr. Tonita CongWoodham.  Concern is that given his quick recurrence, he may need surgery.  Will see how he progresses over the first 24 hours, with lower threshold to take to OR tomorrow if no improvement.  Patient understands this plan and all his questions have been answered.   Howie IllJose Luis Harrol Novello, MD [facility]

## 2018-02-17 ENCOUNTER — Encounter
Admission: EM | Disposition: A | Discharge status: Home Health | Payer: Self-pay | Source: Home / Self Care | Attending: General Surgery

## 2018-02-17 ENCOUNTER — Encounter: Payer: Self-pay | Admitting: Anesthesiology

## 2018-02-17 ENCOUNTER — Inpatient Hospital Stay: Payer: BLUE CROSS/BLUE SHIELD | Admitting: Anesthesiology

## 2018-02-17 ENCOUNTER — Inpatient Hospital Stay: Payer: BLUE CROSS/BLUE SHIELD | Admitting: Surgery

## 2018-02-17 DIAGNOSIS — K56609 Unspecified intestinal obstruction, unspecified as to partial versus complete obstruction: Secondary | ICD-10-CM

## 2018-02-17 HISTORY — PX: LAPAROTOMY: SHX154

## 2018-02-17 HISTORY — PX: LYSIS OF ADHESION: SHX5961

## 2018-02-17 HISTORY — PX: BOWEL RESECTION: SHX1257

## 2018-02-17 LAB — CBC
HEMATOCRIT: 37.7 % — AB (ref 40.0–52.0)
HEMOGLOBIN: 12.6 g/dL — AB (ref 13.0–18.0)
MCH: 30.5 pg (ref 26.0–34.0)
MCHC: 33.5 g/dL (ref 32.0–36.0)
MCV: 91.1 fL (ref 80.0–100.0)
Platelets: 346 10*3/uL (ref 150–440)
RBC: 4.14 MIL/uL — AB (ref 4.40–5.90)
RDW: 14.1 % (ref 11.5–14.5)
WBC: 6.5 10*3/uL (ref 3.8–10.6)

## 2018-02-17 LAB — PROTIME-INR
INR: 0.93
PROTHROMBIN TIME: 12.4 s (ref 11.4–15.2)

## 2018-02-17 LAB — APTT: aPTT: 31 seconds (ref 24–36)

## 2018-02-17 LAB — BASIC METABOLIC PANEL
ANION GAP: 7 (ref 5–15)
BUN: 6 mg/dL (ref 6–20)
CALCIUM: 8.4 mg/dL — AB (ref 8.9–10.3)
CO2: 25 mmol/L (ref 22–32)
CREATININE: 0.76 mg/dL (ref 0.61–1.24)
Chloride: 103 mmol/L (ref 101–111)
Glucose, Bld: 104 mg/dL — ABNORMAL HIGH (ref 65–99)
Potassium: 3.8 mmol/L (ref 3.5–5.1)
SODIUM: 135 mmol/L (ref 135–145)

## 2018-02-17 LAB — MAGNESIUM: MAGNESIUM: 1.9 mg/dL (ref 1.7–2.4)

## 2018-02-17 SURGERY — LAPAROTOMY, EXPLORATORY
Anesthesia: General | Site: Abdomen | Wound class: Contaminated

## 2018-02-17 MED ORDER — SODIUM CHLORIDE 0.9 % IJ SOLN
INTRAMUSCULAR | Status: AC
  Filled 2018-02-17: qty 50

## 2018-02-17 MED ORDER — ACETAMINOPHEN 10 MG/ML IV SOLN
INTRAVENOUS | Status: DC | PRN
  Administered 2018-02-17: 1000 mg via INTRAVENOUS

## 2018-02-17 MED ORDER — DEXAMETHASONE SODIUM PHOSPHATE 10 MG/ML IJ SOLN
INTRAMUSCULAR | Status: DC | PRN
  Administered 2018-02-17: 10 mg via INTRAVENOUS

## 2018-02-17 MED ORDER — FENTANYL CITRATE (PF) 100 MCG/2ML IJ SOLN
25.0000 ug | INTRAMUSCULAR | Status: AC | PRN
  Administered 2018-02-17 (×2): 25 ug via INTRAVENOUS

## 2018-02-17 MED ORDER — SODIUM CHLORIDE 0.9 % IV SOLN
INTRAVENOUS | Status: DC | PRN
  Administered 2018-02-17 (×2): via INTRAVENOUS

## 2018-02-17 MED ORDER — FENTANYL CITRATE (PF) 250 MCG/5ML IJ SOLN
INTRAMUSCULAR | Status: AC
  Filled 2018-02-17: qty 5

## 2018-02-17 MED ORDER — HYDROMORPHONE HCL 1 MG/ML IJ SOLN
INTRAMUSCULAR | Status: AC
  Administered 2018-02-17: 0.5 mg via INTRAVENOUS
  Filled 2018-02-17: qty 1

## 2018-02-17 MED ORDER — LACTATED RINGERS IV BOLUS (SEPSIS)
500.0000 mL | Freq: Once | INTRAVENOUS | Status: AC
  Administered 2018-02-17: 500 mL via INTRAVENOUS

## 2018-02-17 MED ORDER — BUPIVACAINE HCL (PF) 0.5 % IJ SOLN
INTRAMUSCULAR | Status: DC | PRN
  Administered 2018-02-17: 50 mL

## 2018-02-17 MED ORDER — SODIUM CHLORIDE 0.9 % IJ SOLN
INTRAMUSCULAR | Status: DC | PRN
  Administered 2018-02-17: 10 mL via INTRAVENOUS

## 2018-02-17 MED ORDER — HYDROMORPHONE HCL 1 MG/ML IJ SOLN
0.5000 mg | INTRAMUSCULAR | Status: DC | PRN
  Administered 2018-02-17 (×2): 0.5 mg via INTRAVENOUS

## 2018-02-17 MED ORDER — ACETAMINOPHEN 10 MG/ML IV SOLN
INTRAVENOUS | Status: AC
  Filled 2018-02-17: qty 100

## 2018-02-17 MED ORDER — VASOPRESSIN 20 UNIT/ML IV SOLN
INTRAVENOUS | Status: DC | PRN
  Administered 2018-02-17 (×6): 1 [IU] via INTRAVENOUS
  Administered 2018-02-17: 2 [IU] via INTRAVENOUS
  Administered 2018-02-17 (×2): 1 [IU] via INTRAVENOUS

## 2018-02-17 MED ORDER — PIPERACILLIN-TAZOBACTAM 3.375 G IVPB
3.3750 g | Freq: Three times a day (TID) | INTRAVENOUS | Status: DC
Start: 2018-02-17 — End: 2018-02-25
  Administered 2018-02-17 – 2018-02-25 (×23): 3.375 g via INTRAVENOUS
  Filled 2018-02-17 (×23): qty 50

## 2018-02-17 MED ORDER — DEXAMETHASONE SODIUM PHOSPHATE 10 MG/ML IJ SOLN
INTRAMUSCULAR | Status: AC
  Filled 2018-02-17: qty 1

## 2018-02-17 MED ORDER — ROCURONIUM BROMIDE 50 MG/5ML IV SOLN
INTRAVENOUS | Status: AC
  Filled 2018-02-17: qty 1

## 2018-02-17 MED ORDER — CEFAZOLIN SODIUM 1 G IJ SOLR
INTRAMUSCULAR | Status: AC
  Filled 2018-02-17: qty 20

## 2018-02-17 MED ORDER — BUPIVACAINE-EPINEPHRINE (PF) 0.5% -1:200000 IJ SOLN
INTRAMUSCULAR | Status: AC
  Filled 2018-02-17: qty 30

## 2018-02-17 MED ORDER — MIDAZOLAM HCL 2 MG/2ML IJ SOLN
INTRAMUSCULAR | Status: AC
  Administered 2018-02-17: 1 mg via INTRAVENOUS
  Filled 2018-02-17: qty 2

## 2018-02-17 MED ORDER — KETOROLAC TROMETHAMINE 30 MG/ML IJ SOLN
INTRAMUSCULAR | Status: AC
  Filled 2018-02-17: qty 1

## 2018-02-17 MED ORDER — SUCCINYLCHOLINE CHLORIDE 20 MG/ML IJ SOLN
INTRAMUSCULAR | Status: DC | PRN
  Administered 2018-02-17: 100 mg via INTRAVENOUS

## 2018-02-17 MED ORDER — LACTATED RINGERS IV SOLN
INTRAVENOUS | Status: DC | PRN
  Administered 2018-02-17 (×3): via INTRAVENOUS

## 2018-02-17 MED ORDER — SUCCINYLCHOLINE CHLORIDE 20 MG/ML IJ SOLN
INTRAMUSCULAR | Status: AC
  Filled 2018-02-17: qty 1

## 2018-02-17 MED ORDER — HYDROMORPHONE HCL 1 MG/ML IJ SOLN
0.5000 mg | INTRAMUSCULAR | Status: DC | PRN
  Administered 2018-02-18 – 2018-02-25 (×19): 0.5 mg via INTRAVENOUS
  Filled 2018-02-17 (×20): qty 0.5

## 2018-02-17 MED ORDER — SODIUM CHLORIDE 0.9 % IV SOLN
INTRAVENOUS | Status: DC | PRN
  Administered 2018-02-17: 30 ug/min via INTRAVENOUS

## 2018-02-17 MED ORDER — MIDAZOLAM HCL 2 MG/2ML IJ SOLN
INTRAMUSCULAR | Status: DC | PRN
  Administered 2018-02-17: 2 mg via INTRAVENOUS

## 2018-02-17 MED ORDER — PROPOFOL 10 MG/ML IV BOLUS
INTRAVENOUS | Status: DC | PRN
  Administered 2018-02-17: 100 mg via INTRAVENOUS

## 2018-02-17 MED ORDER — SODIUM CHLORIDE 0.9 % IV SOLN
1.0000 g | Freq: Once | INTRAVENOUS | Status: AC
  Administered 2018-02-17: 1 g via INTRAVENOUS
  Filled 2018-02-17: qty 1

## 2018-02-17 MED ORDER — CEFAZOLIN SODIUM-DEXTROSE 2-4 GM/100ML-% IV SOLN
2.0000 g | INTRAVENOUS | Status: AC
  Administered 2018-02-17 (×2): 2 g via INTRAVENOUS
  Filled 2018-02-17: qty 100

## 2018-02-17 MED ORDER — PROPOFOL 10 MG/ML IV BOLUS
INTRAVENOUS | Status: AC
  Filled 2018-02-17: qty 20

## 2018-02-17 MED ORDER — FENTANYL CITRATE (PF) 100 MCG/2ML IJ SOLN
INTRAMUSCULAR | Status: DC | PRN
  Administered 2018-02-17: 50 ug via INTRAVENOUS
  Administered 2018-02-17: 100 ug via INTRAVENOUS
  Administered 2018-02-17 (×2): 50 ug via INTRAVENOUS

## 2018-02-17 MED ORDER — HYDROMORPHONE HCL 1 MG/ML IJ SOLN
0.5000 mg | INTRAMUSCULAR | Status: AC | PRN
  Administered 2018-02-17 (×4): 0.5 mg via INTRAVENOUS

## 2018-02-17 MED ORDER — FENTANYL CITRATE (PF) 100 MCG/2ML IJ SOLN
INTRAMUSCULAR | Status: AC
  Administered 2018-02-17: 25 ug via INTRAVENOUS
  Filled 2018-02-17: qty 2

## 2018-02-17 MED ORDER — ROCURONIUM BROMIDE 100 MG/10ML IV SOLN
INTRAVENOUS | Status: DC | PRN
  Administered 2018-02-17 (×2): 10 mg via INTRAVENOUS
  Administered 2018-02-17 (×2): 20 mg via INTRAVENOUS
  Administered 2018-02-17: 10 mg via INTRAVENOUS
  Administered 2018-02-17: 30 mg via INTRAVENOUS
  Administered 2018-02-17: 20 mg via INTRAVENOUS
  Administered 2018-02-17: 5 mg via INTRAVENOUS
  Administered 2018-02-17: 20 mg via INTRAVENOUS
  Administered 2018-02-17: 35 mg via INTRAVENOUS
  Administered 2018-02-17: 10 mg via INTRAVENOUS

## 2018-02-17 MED ORDER — FENTANYL CITRATE (PF) 100 MCG/2ML IJ SOLN
25.0000 ug | INTRAMUSCULAR | Status: AC | PRN
  Administered 2018-02-17 (×6): 25 ug via INTRAVENOUS

## 2018-02-17 MED ORDER — KETOROLAC TROMETHAMINE 30 MG/ML IJ SOLN
30.0000 mg | Freq: Once | INTRAMUSCULAR | Status: AC
Start: 2018-02-17 — End: 2018-02-17
  Administered 2018-02-17: 30 mg via INTRAVENOUS

## 2018-02-17 MED ORDER — SUGAMMADEX SODIUM 200 MG/2ML IV SOLN
INTRAVENOUS | Status: AC
  Filled 2018-02-17: qty 2

## 2018-02-17 MED ORDER — PROMETHAZINE HCL 25 MG/ML IJ SOLN: 6.2500 mg | INTRAMUSCULAR | Status: DC | PRN

## 2018-02-17 MED ORDER — BUPIVACAINE LIPOSOME 1.3 % IJ SUSP
INTRAMUSCULAR | Status: AC
  Filled 2018-02-17: qty 20

## 2018-02-17 MED ORDER — MIDAZOLAM HCL 2 MG/2ML IJ SOLN
1.0000 mg | Freq: Once | INTRAMUSCULAR | Status: AC
Start: 2018-02-17 — End: 2018-02-17
  Administered 2018-02-17: 1 mg via INTRAVENOUS

## 2018-02-17 MED ORDER — LIDOCAINE HCL (PF) 2 % IJ SOLN
INTRAMUSCULAR | Status: AC
  Filled 2018-02-17: qty 10

## 2018-02-17 MED ORDER — MIDAZOLAM HCL 2 MG/2ML IJ SOLN
INTRAMUSCULAR | Status: AC
  Filled 2018-02-17: qty 2

## 2018-02-17 MED ORDER — SUGAMMADEX SODIUM 200 MG/2ML IV SOLN
INTRAVENOUS | Status: DC | PRN
  Administered 2018-02-17: 200 mg via INTRAVENOUS

## 2018-02-17 MED ORDER — PHENYLEPHRINE HCL 10 MG/ML IJ SOLN
INTRAMUSCULAR | Status: DC | PRN
  Administered 2018-02-17 (×10): 100 ug via INTRAVENOUS
  Administered 2018-02-17: 200 ug via INTRAVENOUS
  Administered 2018-02-17: 300 ug via INTRAVENOUS
  Administered 2018-02-17: 100 ug via INTRAVENOUS
  Administered 2018-02-17: 300 ug via INTRAVENOUS

## 2018-02-17 MED ORDER — LIDOCAINE HCL (CARDIAC) 20 MG/ML IV SOLN
INTRAVENOUS | Status: DC | PRN
  Administered 2018-02-17: 60 mg via INTRAVENOUS

## 2018-02-17 SURGICAL SUPPLY — 46 items
BULB RESERV EVAC DRAIN JP 100C (MISCELLANEOUS) ×6 IMPLANT
CANISTER SUCT 1200ML W/VALVE (MISCELLANEOUS) ×3 IMPLANT
CANISTER SUCT 3000ML (MISCELLANEOUS) ×9 IMPLANT
CHLORAPREP W/TINT 10.5 ML (MISCELLANEOUS) ×3 IMPLANT
COUNTER NEEDLE 20/40 LG (NEEDLE) ×3 IMPLANT
DRAIN CHANNEL JP 19F (MISCELLANEOUS) ×6 IMPLANT
DRAPE LAPAROTOMY 100X77 ABD (DRAPES) ×3 IMPLANT
DRSG OPSITE POSTOP 4X12 (GAUZE/BANDAGES/DRESSINGS) ×6 IMPLANT
DRSG TEGADERM 4X10 (GAUZE/BANDAGES/DRESSINGS) ×3 IMPLANT
DRSG TEGADERM 4X4.75 (GAUZE/BANDAGES/DRESSINGS) ×6 IMPLANT
ELECT BLADE 6.5 EXT (BLADE) ×3 IMPLANT
ELECT CAUTERY BLADE 6.4 (BLADE) ×3 IMPLANT
ELECT REM PT RETURN 9FT ADLT (ELECTROSURGICAL) ×3
ELECTRODE REM PT RTRN 9FT ADLT (ELECTROSURGICAL) ×1 IMPLANT
GAUZE SPONGE 4X4 12PLY STRL (GAUZE/BANDAGES/DRESSINGS) ×6 IMPLANT
GLOVE SURG SYN 7.0 (GLOVE) ×42 IMPLANT
GLOVE SURG SYN 7.5  E (GLOVE) ×28
GLOVE SURG SYN 7.5 E (GLOVE) ×14 IMPLANT
GOWN STRL REUS W/ TWL LRG LVL3 (GOWN DISPOSABLE) ×9 IMPLANT
GOWN STRL REUS W/TWL LRG LVL3 (GOWN DISPOSABLE) ×18
HANDLE YANKAUER SUCT BULB TIP (MISCELLANEOUS) ×6 IMPLANT
LABEL OR SOLS (LABEL) ×3 IMPLANT
LIGASURE IMPACT 36 18CM CVD LR (INSTRUMENTS) ×3 IMPLANT
NEEDLE HYPO 22GX1.5 SAFETY (NEEDLE) ×3 IMPLANT
NS IRRIG 1000ML POUR BTL (IV SOLUTION) ×9 IMPLANT
PACK BASIN MAJOR ARMC (MISCELLANEOUS) ×3 IMPLANT
PACK COLON CLEAN CLOSURE (MISCELLANEOUS) ×3 IMPLANT
RELOAD PROXIMATE 75MM BLUE (ENDOMECHANICALS) ×27 IMPLANT
RELOAD STAPLE SKIN SM 35W (MISCELLANEOUS) ×3 IMPLANT
SEPRAFILM MEMBRANE 5X6 (MISCELLANEOUS) ×3 IMPLANT
SPONGE LAP 18X18 5 PK (GAUZE/BANDAGES/DRESSINGS) ×12 IMPLANT
STAPLER PROXIMATE 75MM BLUE (STAPLE) ×3 IMPLANT
SUT ETHILON 3-0 FS-10 30 BLK (SUTURE) ×3
SUT PDS AB 1 TP1 96 (SUTURE) ×6 IMPLANT
SUT PROLENE 0 CT 1 30 (SUTURE) ×9 IMPLANT
SUT SILK 2 0 (SUTURE) ×6
SUT SILK 2-0 18XBRD TIE 12 (SUTURE) ×3 IMPLANT
SUT SILK 3-0 (SUTURE) ×30 IMPLANT
SUT VIC AB 3-0 SH 27 (SUTURE) ×2
SUT VIC AB 3-0 SH 27X BRD (SUTURE) ×1 IMPLANT
SUTURE EHLN 3-0 FS-10 30 BLK (SUTURE) ×1 IMPLANT
SYR 10ML LL (SYRINGE) ×3 IMPLANT
SYR 30ML LL (SYRINGE) ×6 IMPLANT
TRAY FOLEY W/METER SILVER 16FR (SET/KITS/TRAYS/PACK) ×3 IMPLANT
TUBING CONNECTING 10 (TUBING) ×4 IMPLANT
TUBING CONNECTING 10' (TUBING) ×2

## 2018-02-17 NOTE — Addendum Note (Signed)
Addendum  created 02/17/18 2254 by Berdine Addisonhomas, Sanah Kraska, MD   Sign clinical note

## 2018-02-17 NOTE — Progress Notes (Signed)
02/17/2018  Subjective: Patient reports no significant improvement and felt nauseous this morning.  Continues having pain and pressure over the mid abdomen.  No flatus yet.  Vital signs: Temp:  [97.8 F (36.6 C)-98.4 F (36.9 C)] 98.4 F (36.9 C) (02/28 0431) Pulse Rate:  [79-94] 82 (02/28 0431) Resp:  [12-16] 16 (02/28 0431) BP: (111-137)/(72-85) 123/72 (02/28 0431) SpO2:  [99 %-100 %] 100 % (02/28 0431) Weight:  [70.5 kg (155 lb 6.8 oz)] 70.5 kg (155 lb 6.8 oz) (02/27 0844)   Intake/Output: 02/27 0701 - 02/28 0700 In: [id number] [I.V.:2393; NG/GT:60] Out: 1450 [Urine:950; Emesis/NG output:500] Last BM Date: (pt unsure)  Physical Exam: Constitutional: No acute distress Abdomen:  Soft, mildly distended, with tenderness to palpation over mid abdomen underneath the midline incision.  NG tube to suction with gastric contents  Labs:  Recent Labs    02/16/18 0528 02/17/18 0318  WBC 13.9* 6.5  HGB 14.4 12.6*  HCT 43.8 37.7*  PLT 438 346   Recent Labs    02/16/18 0528 02/17/18 0318  NA 139 135  K 3.9 3.8  CL 106 103  CO2 23 25  GLUCOSE 122* 104*  BUN 10 6  CREATININE 0.86 0.76  CALCIUM 9.3 8.4*   Recent Labs    02/17/18 0318  LABPROT 12.4  INR 0.93    Imaging: Dg Abd 1 View  Result Date: 02/16/2018 CLINICAL DATA:  NG tube placement. EXAM: ABDOMEN - 1 VIEW COMPARISON:  CT scan of the abdomen scout image dated 02/16/2018 FINDINGS: NG tube tip is in the distal esophagus at the gastroesophageal junction. There multiple distended loops of small bowel. Colon is not distended. Contrast in the bladder from recent CT scan. Old deformity of the left ilium. IMPRESSION: NG tube tip in the distal esophagus. Persistent small bowel obstruction. Electronically Signed   By: Francene BoyersJames  Maxwell M.D.   On: 02/16/2018 13:26    Assessment/Plan: 52 yo male with SBO  --discussed with patient that given the quick recurrence and no significant improvement over past 24 hours, would take him to OR  today for exploratory laparotomy for his bowel obstruction. --risks of bleeding, infection, injury to surrounding structures, possible bowel resection, and possible though unlikely need for ostomy have been discussed with patient and he's willing to proceed.   Howie IllJose Luis Pedram Goodchild, MD [facility]

## 2018-02-17 NOTE — Transfer of Care (Signed)
Immediate Anesthesia Transfer of Care Note  Patient: Mike EwingSteven D Kim  Procedure(s) Performed: EXPLORATORY LAPAROTOMY (N/A Abdomen) LYSIS OF ADHESION (N/A Abdomen) SMALL BOWEL RESECTION (N/A Abdomen)  Patient Location: PACU  Anesthesia Type:General  Level of Consciousness: awake  Airway & Oxygen Therapy: Patient Spontanous Breathing and Patient connected to face mask oxygen  Post-op Assessment: Report given to RN and Post -op Vital signs reviewed and stable  Post vital signs: Reviewed  Last Vitals:  Vitals:   02/17/18 1205 02/17/18 2028  BP: 118/71 130/87  Pulse: (!) 105 99  Resp: 17 15  Temp: 36.9 C (!) 36.2 C  SpO2: 98% 100%    Last Pain:  Vitals:   02/17/18 1205  TempSrc: Oral  PainSc: 9       Patients Stated Pain Goal: 0 (02/17/18 0820)  Complications: No apparent anesthesia complications

## 2018-02-17 NOTE — Progress Notes (Signed)
Patient called nurse phone yelling out in pain, when nurse arrived to room patient stated " this is the worst its been since I've been here. Pain was to lower abdomen mid area. Upper right and left quadrant distended and hard. Notified Dr. Aleen CampiPiscoya of exacerbation in pain, patient scheduled for surgery around 12pm. Pain medications given to patient, patient stated that the pain has decreased some.

## 2018-02-17 NOTE — Anesthesia Procedure Notes (Signed)
Procedure Name: Intubation Date/Time: 02/17/2018 1:11 PM Performed by: Karoline CaldwellStarr, Mike Salvino, CRNA Pre-anesthesia Checklist: Patient identified, Patient being monitored, Timeout performed, Emergency Drugs available and Suction available Patient Re-evaluated:Patient Re-evaluated prior to induction Oxygen Delivery Method: Circle system utilized Preoxygenation: Pre-oxygenation with 100% oxygen Induction Type: IV induction, Rapid sequence and Cricoid Pressure applied Laryngoscope Size: McGraph and 4 Grade View: Grade I Tube type: Oral Tube size: 7.5 mm Number of attempts: 1 Airway Equipment and Method: Stylet Placement Confirmation: ETT inserted through vocal cords under direct vision,  positive ETCO2 and breath sounds checked- equal and bilateral Secured at: 22 cm Tube secured with: Tape Dental Injury: Teeth and Oropharynx as per pre-operative assessment

## 2018-02-17 NOTE — Progress Notes (Signed)
Pharmacy Antibiotic Note  Mike EwingSteven D Kuhlman is a 52 y.o. male admitted on 02/16/2018 with intra abdominal infection.  Pharmacy has been consulted for zosyn dosing.  Plan: Zosyn 3.375g IV q8h (4 hour infusion).  Height: 5\' 9"  (175.3 cm) Weight: 155 lb 6.8 oz (70.5 kg) IBW/kg (Calculated) : 70.7  Temp (24hrs), Avg:98 F (36.7 C), Min:97.1 F (36.2 C), Max:98.4 F (36.9 C)  Recent Labs  Lab 02/16/18 0528 02/17/18 0318  WBC 13.9* 6.5  CREATININE 0.86 0.76    Estimated Creatinine Clearance: 110.2 mL/min (by C-G formula based on SCr of 0.76 mg/dL).    No Known Allergies  Antimicrobials this admission: Anti-infectives (From admission, onward)   Start     Dose/Rate Route Frequency Ordered Stop   02/17/18 2200  piperacillin-tazobactam (ZOSYN) IVPB 3.375 g     3.375 g 12.5 mL/hr over 240 Minutes Intravenous Every 8 hours 02/17/18 2102     02/17/18 1400  ertapenem (INVANZ) 1 g in sodium chloride 0.9 % 100 mL IVPB     1 g 200 mL/hr over 30 Minutes Intravenous  Once 02/17/18 1346 02/17/18 1428   02/17/18 0900  [MAR Hold]  ceFAZolin (ANCEF) IVPB 2g/100 mL premix     (MAR Hold since 02/17/18 1202)   2 g 200 mL/hr over 30 Minutes Intravenous On call to O.R. 02/17/18 [id number] 02/17/18 1844       Microbiology results: No results found for this or any previous visit (from the past 240 hour(s)).   Thank you for allowing pharmacy to be a part of this patient's care.  Gerre PebblesGarrett Kael Keetch 02/17/2018 9:03 PM

## 2018-02-17 NOTE — Anesthesia Postprocedure Evaluation (Signed)
Anesthesia Post Note  Patient: Mike EwingSteven D Kim  Procedure(s) Performed: EXPLORATORY LAPAROTOMY (N/A Abdomen) LYSIS OF ADHESION (N/A Abdomen) SMALL BOWEL RESECTION (N/A Abdomen)  Patient location during evaluation: PACU Anesthesia Type: General Level of consciousness: awake and alert Pain management: pain level controlled Vital Signs Assessment: post-procedure vital signs reviewed and stable Respiratory status: spontaneous breathing, nonlabored ventilation, respiratory function stable and patient connected to nasal cannula oxygen Cardiovascular status: blood pressure returned to baseline and stable Postop Assessment: no apparent nausea or vomiting Anesthetic complications: no     Last Vitals:  Vitals:   02/17/18 2235 02/17/18 2243  BP:  91/69  Pulse: (!) 117 (!) 113  Resp: 17 20  Temp: 37.2 C 37.2 C  SpO2: 99% 98%    Last Pain:  Vitals:   02/17/18 2243  TempSrc:   PainSc: 0-No pain                 Dillin Lofgren S

## 2018-02-17 NOTE — Care Management (Signed)
Pt doing well. Initially the heart rate was 120 - 130. Fluid bolus given. Pain meds ordered. Doing better now. HR in the 110 -115 range. Hospitalist consult requested.

## 2018-02-17 NOTE — Brief Op Note (Signed)
02/17/2018  8:41 PM  PATIENT:  Mike Kim  52 y.o. male  PRE-OPERATIVE DIAGNOSIS:  small bowel obstruction  POST-OPERATIVE DIAGNOSIS:  small bowel obstruction  PROCEDURE:  Procedure(s): EXPLORATORY LAPAROTOMY (N/A) LYSIS OF ADHESION (N/A) SMALL BOWEL RESECTION (N/A)  SURGEON:  Surgeon(s) and Role:    * Odysseus Cada, Elita QuickJose, MD - Primary    * Hulda Marinaks, Timothy, MD - Assisting  ANESTHESIA:   general  EBL:  100 mL   BLOOD ADMINISTERED:none  DRAINS: (19 Fr) Jackson-Pratt drain(s) with closed bulb suction in the pelvis and gutters   LOCAL MEDICATIONS USED:  BUPIVICAINE   SPECIMEN:  Source of Specimen:  small bowel   DISPOSITION OF SPECIMEN:  PATHOLOGY  COUNTS:  YES  TOURNIQUET:  * No tourniquets in log *  DICTATION: .Dragon Dictation  PLAN OF CARE: Back to floor  PATIENT DISPOSITION:  PACU - hemodynamically stable.   Delay start of Pharmacological VTE agent (>24hrs) due to surgical blood loss or risk of bleeding: yes
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
## 2018-02-17 NOTE — Anesthesia Post-op Follow-up Note (Signed)
Anesthesia QCDR form completed.        

## 2018-02-18 ENCOUNTER — Encounter: Payer: Self-pay | Admitting: Surgery

## 2018-02-18 ENCOUNTER — Inpatient Hospital Stay: Payer: Self-pay

## 2018-02-18 LAB — COMPREHENSIVE METABOLIC PANEL
ALBUMIN: 2.6 g/dL — AB (ref 3.5–5.0)
ALK PHOS: 40 U/L (ref 38–126)
ALT: 21 U/L (ref 17–63)
ANION GAP: 8 (ref 5–15)
AST: 51 U/L — ABNORMAL HIGH (ref 15–41)
BUN: 10 mg/dL (ref 6–20)
CALCIUM: 7.8 mg/dL — AB (ref 8.9–10.3)
CO2: 25 mmol/L (ref 22–32)
Chloride: 108 mmol/L (ref 101–111)
Creatinine, Ser: 0.86 mg/dL (ref 0.61–1.24)
GFR calc Af Amer: >60 mL/min (ref >60)
GFR calc non Af Amer: >60 mL/min (ref >60)
GLUCOSE: 113 mg/dL — AB (ref 65–99)
Potassium: 4.2 mmol/L (ref 3.5–5.1)
SODIUM: 141 mmol/L (ref 135–145)
Total Bilirubin: 0.5 mg/dL (ref 0.3–1.2)
Total Protein: 5.5 g/dL — ABNORMAL LOW (ref 6.5–8.1)

## 2018-02-18 LAB — CBC WITH DIFFERENTIAL/PLATELET
BASOS PCT: 0 %
Basophils Absolute: 0 10*3/uL (ref 0–0.1)
EOS ABS: 0 10*3/uL (ref 0–0.7)
EOS PCT: 0 %
HCT: 38.3 % — ABNORMAL LOW (ref 40.0–52.0)
HEMOGLOBIN: 12.9 g/dL — AB (ref 13.0–18.0)
Lymphocytes Relative: 2 %
Lymphs Abs: 0.1 10*3/uL — ABNORMAL LOW (ref 1.0–3.6)
MCH: 30.9 pg (ref 26.0–34.0)
MCHC: 33.6 g/dL (ref 32.0–36.0)
MCV: 91.9 fL (ref 80.0–100.0)
Monocytes Absolute: 0.2 10*3/uL (ref 0.2–1.0)
Monocytes Relative: 4 %
NEUTROS PCT: 94 %
Neutro Abs: 4.4 10*3/uL (ref 1.4–6.5)
PLATELETS: 276 10*3/uL (ref 150–440)
RBC: 4.17 MIL/uL — AB (ref 4.40–5.90)
RDW: 14.3 % (ref 11.5–14.5)
WBC: 4.7 10*3/uL (ref 3.8–10.6)

## 2018-02-18 LAB — GLUCOSE, CAPILLARY: GLUCOSE-CAPILLARY: 94 mg/dL (ref 65–99)

## 2018-02-18 LAB — PHOSPHORUS: Phosphorus: 3.8 mg/dL (ref 2.5–4.6)

## 2018-02-18 LAB — TROPONIN I: Troponin I: <0.03 ng/mL (ref <0.03)

## 2018-02-18 LAB — MAGNESIUM: Magnesium: 1.4 mg/dL — ABNORMAL LOW (ref 1.7–2.4)

## 2018-02-18 MED ORDER — SODIUM CHLORIDE 0.9 % IV SOLN
INTRAVENOUS | Status: DC
  Administered 2018-02-18 – 2018-02-24 (×7): via INTRAVENOUS

## 2018-02-18 MED ORDER — INSULIN ASPART 100 UNIT/ML ~~LOC~~ SOLN
0.0000 [IU] | Freq: Four times a day (QID) | SUBCUTANEOUS | Status: DC
  Administered 2018-02-18: 1 [IU] via SUBCUTANEOUS
  Filled 2018-02-18: qty 1

## 2018-02-18 MED ORDER — PROPOFOL 10 MG/ML IV BOLUS
INTRAVENOUS | Status: AC
  Filled 2018-02-18: qty 20

## 2018-02-18 MED ORDER — SODIUM CHLORIDE 0.9% FLUSH: 10.0000 mL | INTRAVENOUS | Status: DC | PRN

## 2018-02-18 MED ORDER — TRACE MINERALS CR-CU-MN-SE-ZN 10-1000-500-60 MCG/ML IV SOLN
INTRAVENOUS | Status: AC
  Administered 2018-02-18: 19:00:00 via INTRAVENOUS
  Filled 2018-02-18: qty 960

## 2018-02-18 MED ORDER — MAGNESIUM SULFATE 2 GM/50ML IV SOLN
2.0000 g | Freq: Once | INTRAVENOUS | Status: AC
  Administered 2018-02-18: 2 g via INTRAVENOUS
  Filled 2018-02-18: qty 50

## 2018-02-18 MED ORDER — METOPROLOL TARTRATE 5 MG/5ML IV SOLN: 2.5000 mg | Freq: Four times a day (QID) | INTRAVENOUS | Status: DC | PRN

## 2018-02-18 MED ORDER — SUCCINYLCHOLINE CHLORIDE 20 MG/ML IJ SOLN
INTRAMUSCULAR | Status: AC
  Filled 2018-02-18: qty 1

## 2018-02-18 MED ORDER — SODIUM CHLORIDE 0.9 % IV BOLUS (SEPSIS)
1000.0000 mL | Freq: Once | INTRAVENOUS | Status: AC
  Administered 2018-02-18: 1000 mL via INTRAVENOUS

## 2018-02-18 MED ORDER — SODIUM CHLORIDE 0.9% FLUSH
10.0000 mL | Freq: Two times a day (BID) | INTRAVENOUS | Status: DC
  Administered 2018-02-18 – 2018-02-20 (×4): 10 mL
  Administered 2018-02-21: 20 mL
  Administered 2018-02-22 – 2018-02-25 (×4): 10 mL
  Administered 2018-02-25: 20 mL

## 2018-02-18 MED ORDER — SODIUM CHLORIDE 0.9 % IV SOLN
INTRAVENOUS | Status: DC
  Administered 2018-02-18 (×2): via INTRAVENOUS

## 2018-02-18 MED ORDER — FAT EMULSION 20 % IV EMUL
250.0000 mL | INTRAVENOUS | Status: AC
Start: 2018-02-18 — End: 2018-02-19
  Administered 2018-02-18: 250 mL via INTRAVENOUS
  Filled 2018-02-18: qty 250

## 2018-02-18 NOTE — Progress Notes (Signed)
Foley was removed per order, pt is due to void by 2200 02/18/2018.   Mike Kim [place]

## 2018-02-18 NOTE — Progress Notes (Signed)
Peripherally Inserted Central Catheter/Midline Placement  The IV Nurse has discussed with the patient and/or persons authorized to consent for the patient, the purpose of this procedure and the potential benefits and risks involved with this procedure.  The benefits include less needle sticks, lab draws from the catheter, and the patient may be discharged home with the catheter. Risks include, but not limited to, infection, bleeding, blood clot (thrombus formation), and puncture of an artery; nerve damage and irregular heartbeat and possibility to perform a PICC exchange if needed/ordered by physician.  Alternatives to this procedure were also discussed.  Bard Power PICC patient education guide, fact sheet on infection prevention and patient information card has been provided to patient /or left at bedside.    PICC/Midline Placement Documentation        Mike Kim, Mike Kim 02/18/2018, 4:12 PM

## 2018-02-18 NOTE — Progress Notes (Addendum)
Initial Nutrition Assessment  DOCUMENTATION CODES:   Not applicable  INTERVENTION:   Pt is at moderate refeeding risk. Monitor K/Mg/Phos until labs stabilize  Recommend decrease IV fluid rate to 385ml/hr  Initiate Clinimix 5/20 with electrolytes to at 5940ml/hr (goal rate 9183ml/hr when labs stabilize)   20% lipids @20ml /hr x 12 hrs/day    Regimen at goal provides 2235 kcal/day, 100g/day protein, 2232ml volume   Daily weights  MVI daily  Trace elements daily    NUTRITION DIAGNOSIS:   Inadequate oral intake related to acute illness as evidenced by NPO status.   GOAL:   Patient will meet greater than or equal to 90% of their needs   MONITOR:   Diet advancement, Labs, Weight trends, I & O's, Other (Comment)(TPN)  REASON FOR ASSESSMENT:   Consult New TPN/TNA  ASSESSMENT:   52 yo male re-admitted 2/27 for ex lap, abdominal pain and emesis secondary to small bowel obstruction secondary to intestinal adhesions. Acute tachycardia.  PMH traumatic brain injury  3/1 surgery performed: ex lap, extensive lysis of adhesions with small bowel resection, repair serosal injuries, appendectomy.   PT has NG tube (green with brown output; 375 mL 3/1 at time of nutrition assessment; total daily output 700 mL/d)  Pt had an appetite after previous discharge. He ate canned fruit/vegetables and had a couple normal bowel movements. Reports taking metamucil at home. On day prior to current admission (2/26) pt ate pizza (NPO since this meal, pt reports that this meal "clogged him up" and has not had BM since)  Pt reports that has had no BM or flatus since admission to hospital. No movement/noises in abdomen since yesterday. Currently not hungry, but pt is thirsty.   Per MD note:  acute tachycardia suspected due to acute dehydration and acute pain Check EKG, CBC, CMP, blood cultures, IV fluid bolus with normal saline solution, increase IV fluids for rehydration, rule out acute coronary  syndrome with cardiac enzymes x3 sets, and continue close medical monitoring   Medications reviewed and include:  protonix, NaCl w/ KCl @125ml /hr, toradol, dilaudid (Q4hr)   Labs Phosphorus 3.8 (WNL) Mg 1.4 (L) Na 141 (WNL) K 4.2 (WNL) Cl 108 (WNL) BUN & Cr WNL as of 3/1   Nutrition-Focused physical exam completed. Findings are no fat depletion, no muscle depletion, and no edema.   Per medical record, pt smokes cigarettes and drinks beer regularly   NUTRITION - FOCUSED PHYSICAL EXAM:  No muscle or fat depletions noted.   Diet Order:  Diet NPO time specified .TPN (CLINIMIX-E) Adult  EDUCATION NEEDS:   Education needs have been addressed  Skin:  Skin Assessment: Skin Integrity Issues: Skin Integrity Issues:: Incisions Incisions: Closed abdominal incision  Last BM:  No BM since admission; last BM reported by Pt PTA 2/26  Height:   Ht Readings from Last 1 Encounters:  02/17/18 5\' 9"  (1.753 m)    Weight:   Wt Readings from Last 1 Encounters:  02/18/18 164 lb 3.9 oz (74.5 kg)    Ideal Body Weight:  72.7 kg  BMI:  Body mass index is 24.25 kg/m.  Estimated Nutritional Needs:   Kcal:  1,850- 2,150 kcal/day  Protein:  92-106 grams  Fluid:  > 1.9 L/day     Marjie Skiffherie N. Cobe Viney, MS Dietetic Intern Pager: [phone]

## 2018-02-18 NOTE — Consult Note (Addendum)
PHARMACY - ADULT TOTAL PARENTERAL NUTRITION CONSULT NOTE   Pharmacy Consult for TPN Indication: Prolonged Ileus  Patient Measurements: Height: 5\' 9"  (175.3 cm) Weight: 155 lb 6.8 oz (70.5 kg) IBW/kg (Calculated) : 70.7 TPN AdjBW (KG): 70.5 Body mass index is 22.95 kg/m. Usual Weight:   Assessment:   GI:  Endo:  Insulin requirements in the past 24 hours: 0 Lytes: K=4.2, Mg=1.4, phos=3.8 Renal:scr=0.86 crcl 102.695ml/min Pulm: Cards:  Hepatobil: Neuro: ID: zosyn 3.375g q 8hr  TPN Access: PICC ordered 3/1 TPN start date: Nutritional Goals (per RD recommendation on ): kCal: Protein:  Fluid:  Goal TPN rate is 85 ml/hr (provides 2235 kcal/day, 100g/day protein, 2232ml volume)  Current Nutrition:   Plan:  Clinimix E 5/20 at 40 mL/hr. Lipids @ 420ml/hr x 12 hr Add MVI, trace elements to TPN Sensitive SSI q 6 hr and adjust as needed NS @ 12925ml/hr currently Max volume 19425ml/hr per surgery- therefore when TPN starts will decrease to 5685ml/hr- will continue to adjust for a total volume of 17625ml/hr Mg low at 1.4. Give Mg IV 2g once.  Follow up electrolytes tomorrow AM. Mg, Phos,K daily for at least the first 3 days TG in the AM CBC w/ diff, TG, CMP weekly Daily weights, ins and outs If electrolytes are stable tomorrow, will increase TPN to goal Olene FlossMelissa D Maccia, Pharm.D, BCPS Clinical Pharmacist  02/18/2018,12:55 PM

## 2018-02-18 NOTE — Op Note (Addendum)
Procedure Date:  02/17/2018  Pre-operative Diagnosis:  Small bowel obstruction  Post-operative Diagnosis:  Small bowel obstruction  Procedure:  Exploratory Laparotomy, extensive lysis of adhesions for >4 hours, small bowel resection with side to side anastomosis, primary repair of serosal injuries x 4, appendectomy  Surgeon:  Howie IllJose Luis Chalene Treu, MD  Assistant:  Hulda Marinimothy Oaks, MD.  His assistance was required due to the complexity and difficulty of the case, requiring approximately 7 hours to complete.  His assistance was of critical importance for all portions of the case.  Anesthesia:  General endotracheal  Estimated Blood Loss:  100 ml  Specimens:  Small bowel, appendix  Complications:  None  Indications for Procedure:  This is a 52 y.o. male who presents with recurrent small bowel obstruction without improvement on medical management.  The risks of bleeding, abscess or infection, injury to surrounding structures, and need for further procedures were all discussed with the patient and was willing to proceed.  Description of Procedure: The patient was correctly identified in the preoperative area and brought into the operating room.  The patient was placed supine with VTE prophylaxis in place.  Appropriate time-outs were performed.  Anesthesia was induced and the patient was intubated.  Foley catheter was placed.  Appropriate antibiotics were infused.  The abdomen was prepped and draped in a sterile fashion.  A midline incision was made over the patient's previous scar and electrocautery was used to dissect down the subcutaneous tissue to the fascia.  Upon trying to incise the fascia to get into the abdomen, bilious succus poured from an enterotomy.  This was suctioned out.  It took 1+ hour to get into the abdomen due to severely adhered bowel to the anterior abdominal wall, along the entire length of the patient's previous exploratory laparotomy. A total of 4 enterotomies were made in the  dissection. The enterotomies were temporarily controlled with 3-0 silk sutures. These adhesions were taken down sharply using Metzenbaum scissors.  The planes were difficult to distinguish and on the left side, we did dissect the posterior sheath.  On the right side, the adhesions were flimsier and we were able to dissect better from right side superiorly to inferiorly, and across to the left side from the inferior portion and then up superiorly.  Once the abdominal wall was freed completely, we had full exposure to the abdominal cavity.  The next 3+ hours were dedicated to lysis of adhesions between small bowel loops, between small bowel and mesentery, between small bowel and colon in different segments, and between small bowel and lateral abdominal wall.  Some serosal tears were created in the process, all of which were repaired primarily using 3-0 silk sutures.  Once all the small bowel was freed, it was noted that the distal ileum was decompressed and proximally was more dilated, but a true transition point was never identified during lysis of adhesions.  Likely his obstruction was due to a combination of all his adhesions and a frozen abdomen.  Given how significantly adhered his bowel was, it was also decided to perform an appendectomy.  The appendix was identified in retrocecal location.  The base of the appendix was dissected free and clamped with Kelly clamps and cut.  The base was ligated using 2-0 silk tie.  The appendiceal stump was then imbricated into the cecum using a 3-0 silk purse string suture.  The appendix was then dissected free and the mesoappendix was cauterized.  The appendix was sent as a separate specimen.  We then examined the section of the enterotomies created, and we decided to excise about 1 foot of bowel length that was involved.  We created the appropriate windows in the mesentery and used GIA blue load 75 mm staplers to staple across proximal and distal sections.  Two  enterotomies were created at the staple lines and a common channel was created in side to side fashion using GIA stapler.  The bowel was milked of its contents towards the open ends and suctioned out.  Alis clamps were used to line up the edges of the common channel and this was closed using another stapler load.  The staple line was imbricated using 3-0 silk sutures.  At that point, it was noted that our new anastomosis was too narrow and we decided to create a new one.  Two new enterotomies were created at the staple line and a new common channel was created using a 75 mm stapler.  Alis clamps again used and the common channel was closed with another stapler load.  The staple line was again imbricated using 3-0 silk.  The anastomosis was of appropriate size at that point.  The mesenteric defect was closed using 3-0 silk.  The bowel was again run, making sure all serosal tears were appropriately repaired using 3-0 silk.  The abdomen was thoroughly irrigated using warm saline.  Two 19 Fr blake drains were placed, with right drain going to the pelvis and up towards the left gutter, and the left drain going from left gutter to right gutter superiorly.  60 ml of Exparel solution combined with 0.5% bupivacaine with epi were infused onto the fascia, peritoneum, and subcutaneous tissue.  The midline was closed using two #1 loop PDS sutures.  The drains were secured in place using 3-0 Nylon sutures.  The incision was irrigated again and the skin was closed with staples.  The skin was cleaned and dressed with Honeycomb dressings and the drains were dressed with 4x4 and Tegaderm.  The patient was emerged from anesthesia and extubated and brought to the recovery room for further management.  The patient tolerated the procedure well and all counts were correct at the end of the case.   [name], MD
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
## 2018-02-18 NOTE — Progress Notes (Signed)
02/18/2018  Subjective: Patient had some tachycardia last night post-op.  He was given IV fluid bolus and his tachycardia improved.  Labs obtained were unremarkable.  Vital signs: Temp:  [97.1 F (36.2 C)-99 F (37.2 C)] 97.9 F (36.6 C) (03/01 0354) Pulse Rate:  [99-131] 106 (03/01 0354) Resp:  [11-20] 16 (03/01 0354) BP: (91-130)/(69-91) 115/79 (03/01 0354) SpO2:  [92 %-100 %] 99 % (03/01 0354) Weight:  [70.5 kg (155 lb 6.8 oz)] 70.5 kg (155 lb 6.8 oz) (02/28 1205)   Intake/Output: 02/28 0701 - 03/01 0700 In: 7903.3 [I.V.:6803.3; IV Piggyback:1100] Out: 3510 [Urine:900; Emesis/NG output:700; Drains:310; Blood:100] Last BM Date: 02/02/18(per pt. report)  Physical Exam: Constitutional: No acute distress Cardiac:  Low grade tachycardia, but sinus Abdomen:  Soft, nondistended, appropriately tender to palpation.  Incision is clean and intact with staples in place.  JP drains with serosanguinous fluid.  NG tube in place.  Labs:  Recent Labs    02/17/18 0318 02/18/18 0428  WBC 6.5 4.7  HGB 12.6* 12.9*  HCT 37.7* 38.3*  PLT 346 276   Recent Labs    02/17/18 0318 02/18/18 0428  NA 135 141  K 3.8 4.2  CL 103 108  CO2 25 25  GLUCOSE 104* 113*  BUN 6 10  CREATININE 0.76 0.86  CALCIUM 8.4* 7.8*   Recent Labs    02/17/18 0318  LABPROT 12.4  INR 0.93    Imaging: No results found.  Assessment/Plan: 52 yo male s/p exlap with extensive lysis of adhesions, small bowel resection, and appendectomy  --continue NPO today with NG tube in place and IV fluid hydration --Place PICC line today and start TPN -- expect patient to have a prolonged ileus postop given the significant adhesions and manipulation during surgery yesterday. --OOB, ambulate --d/c foley catheter --tachycardia likely physiological given surgery yesterday and pain issues.   Howie IllJose Luis Wojciech Willetts, MD [facility]
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
## 2018-02-19 LAB — DIFFERENTIAL
BASOS ABS: 0 10*3/uL (ref 0–0.1)
BASOS PCT: 0 %
Eosinophils Absolute: 0 10*3/uL (ref 0–0.7)
Eosinophils Relative: 0 %
LYMPHS PCT: 5 %
Lymphs Abs: 0.5 10*3/uL — ABNORMAL LOW (ref 1.0–3.6)
MONO ABS: 0.5 10*3/uL (ref 0.2–1.0)
Monocytes Relative: 5 %
NEUTROS ABS: 9.1 10*3/uL — AB (ref 1.4–6.5)
Neutrophils Relative %: 90 %

## 2018-02-19 LAB — COMPREHENSIVE METABOLIC PANEL
ALBUMIN: 2.3 g/dL — AB (ref 3.5–5.0)
ALT: 18 U/L (ref 17–63)
ANION GAP: 7 (ref 5–15)
AST: 33 U/L (ref 15–41)
Alkaline Phosphatase: 44 U/L (ref 38–126)
BILIRUBIN TOTAL: 0.4 mg/dL (ref 0.3–1.2)
BUN: 11 mg/dL (ref 6–20)
CO2: 24 mmol/L (ref 22–32)
Calcium: 7.7 mg/dL — ABNORMAL LOW (ref 8.9–10.3)
Chloride: 105 mmol/L (ref 101–111)
Creatinine, Ser: 0.65 mg/dL (ref 0.61–1.24)
GFR calc Af Amer: >60 mL/min (ref >60)
GFR calc non Af Amer: >60 mL/min (ref >60)
GLUCOSE: 106 mg/dL — AB (ref 65–99)
Potassium: 3.4 mmol/L — ABNORMAL LOW (ref 3.5–5.1)
Sodium: 136 mmol/L (ref 135–145)
TOTAL PROTEIN: 5.4 g/dL — AB (ref 6.5–8.1)

## 2018-02-19 LAB — CBC
HCT: 30.3 % — ABNORMAL LOW (ref 40.0–52.0)
Hemoglobin: 10.3 g/dL — ABNORMAL LOW (ref 13.0–18.0)
MCH: 31.7 pg (ref 26.0–34.0)
MCHC: 34 g/dL (ref 32.0–36.0)
MCV: 93.4 fL (ref 80.0–100.0)
PLATELETS: 244 10*3/uL (ref 150–440)
RBC: 3.24 MIL/uL — AB (ref 4.40–5.90)
RDW: 14.4 % (ref 11.5–14.5)
WBC: 10.1 10*3/uL (ref 3.8–10.6)

## 2018-02-19 LAB — MAGNESIUM: Magnesium: 2 mg/dL (ref 1.7–2.4)

## 2018-02-19 LAB — GLUCOSE, CAPILLARY
GLUCOSE-CAPILLARY: 86 mg/dL (ref 65–99)
GLUCOSE-CAPILLARY: 86 mg/dL (ref 65–99)
GLUCOSE-CAPILLARY: 91 mg/dL (ref 65–99)
Glucose-Capillary: 125 mg/dL — ABNORMAL HIGH (ref 65–99)
Glucose-Capillary: 96 mg/dL (ref 65–99)

## 2018-02-19 LAB — PHOSPHORUS
Phosphorus: 1.3 mg/dL — ABNORMAL LOW (ref 2.5–4.6)
Phosphorus: 2.5 mg/dL (ref 2.5–4.6)

## 2018-02-19 LAB — POTASSIUM: POTASSIUM: 3.9 mmol/L (ref 3.5–5.1)

## 2018-02-19 LAB — PREALBUMIN: PREALBUMIN: 14 mg/dL — AB (ref 18–38)

## 2018-02-19 LAB — TRIGLYCERIDES: TRIGLYCERIDES: 46 mg/dL (ref <150)

## 2018-02-19 MED ORDER — TRACE MINERALS CR-CU-MN-SE-ZN 10-1000-500-60 MCG/ML IV SOLN
INTRAVENOUS | Status: AC
  Administered 2018-02-19: 18:00:00 via INTRAVENOUS
  Filled 2018-02-19: qty 960

## 2018-02-19 MED ORDER — POTASSIUM PHOSPHATES 15 MMOLE/5ML IV SOLN
30.0000 mmol | Freq: Once | INTRAVENOUS | Status: AC
  Administered 2018-02-19: 30 mmol via INTRAVENOUS
  Filled 2018-02-19: qty 10

## 2018-02-19 MED ORDER — FAT EMULSION 20 % IV EMUL
300.0000 mL | INTRAVENOUS | Status: AC
  Administered 2018-02-19: 300 mL via INTRAVENOUS
  Filled 2018-02-19: qty 300

## 2018-02-19 MED ORDER — ENOXAPARIN SODIUM 40 MG/0.4ML ~~LOC~~ SOLN
40.0000 mg | SUBCUTANEOUS | Status: DC
  Administered 2018-02-19 – 2018-02-26 (×6): 40 mg via SUBCUTANEOUS
  Filled 2018-02-19 (×7): qty 0.4

## 2018-02-19 NOTE — Consult Note (Signed)
PHARMACY - ADULT TOTAL PARENTERAL NUTRITION CONSULT NOTE   Pharmacy Consult for TPN Indication: Prolonged Ileus  Patient Measurements: Height: 5\' 9"  (175.3 cm) Weight: 162 lb 7.7 oz (73.7 kg) IBW/kg (Calculated) : 70.7 TPN AdjBW (KG): 70.5 Body mass index is 23.99 kg/m. Usual Weight:   Assessment:   GI:  Endo:  Insulin requirements in the past 24 hours: 0 Lytes: K=4.2, Mg=1.4, phos=3.8 Renal: crcl 110.215ml/min Pulm: Cards:  Hepatobil: Neuro: ID: zosyn 3.375g q 8hr  TPN Access: PICC ordered 3/1 TPN start date: Nutritional Goals (per RD recommendation on ): kCal: Protein:  Fluid:  Goal TPN rate is 85 ml/hr (provides 2235 kcal/day, 100g/day protein, 2232ml volume)  Current Nutrition:   Plan:  Clinimix E 5/15 at 40 mL/hr. Lipids @ 25 ml/hr x 12 hr Add MVI, trace elements to TPN Sensitive SSI q 6 hr and adjust as needed NS @ 85 ml/hr currently Max volume 17625ml/hr per surgery- therefore when TPN starts will decrease to 1085ml/hr- will continue to adjust for a total volume of 19425ml/hr  K = 3.4, Mag = 2.0, Phos = 1.3. Ordered Potassium Phosphate 30 mmol IV x 1 dose. Recheck K and Phos tonight at 20:00.  Follow up electrolytes tomorrow AM. Mg, Phos,K daily for at least the first 3 days  3/2 TG=46 CBC w/ diff, TG, CMP weekly Daily weights, ins and outs   Stormy CardKatsoudas,Kevork Joyce K, [facility] CenterRPH Clinical Pharmacist 02/19/2018,2:30 PM

## 2018-02-19 NOTE — Consult Note (Signed)
PHARMACY - ADULT TOTAL PARENTERAL NUTRITION CONSULT NOTE   Pharmacy Consult for TPN Indication: Prolonged Ileus  Patient Measurements: Height: 5\' 9"  (175.3 cm) Weight: 162 lb 7.7 oz (73.7 kg) IBW/kg (Calculated) : 70.7 TPN AdjBW (KG): 70.5 Body mass index is 23.99 kg/m. Usual Weight:   Assessment:   GI:  Endo:  Insulin requirements in the past 24 hours: 0 Lytes: K=4.2, Mg=1.4, phos=3.8 Renal: crcl 110.465ml/min Pulm: Cards:  Hepatobil: Neuro: ID: zosyn 3.375g q 8hr  TPN Access: PICC ordered 3/1 TPN start date: Nutritional Goals (per RD recommendation on ): kCal: Protein:  Fluid:  Goal TPN rate is 85 ml/hr (provides 2235 kcal/day, 100g/day protein, 2232ml volume)  Current Nutrition:   Plan:  Clinimix E 5/15 at 40 mL/hr. Lipids @ 25 ml/hr x 12 hr Add MVI, trace elements to TPN Sensitive SSI q 6 hr and adjust as needed NS @ 85 ml/hr currently Max volume 13025ml/hr per surgery- therefore when TPN starts will decrease to 4985ml/hr- will continue to adjust for a total volume of 16125ml/hr  K = 3.4, Mag = 2.0, Phos = 1.3. Ordered Potassium Phosphate 30 mmol IV x 1 dose. Recheck K and Phos tonight at 20:00.  Follow up electrolytes tomorrow AM. Mg, Phos,K daily for at least the first 3 days  3/2 TG=46 CBC w/ diff, TG, CMP weekly Daily weights, ins and outs   3/2 2130 K 3.9, Phos 2.5, labs WNL. Will recheck with AM labs   Luan PullingGarrett Evangeline Utley, [facility]lth ServicesRPH Clinical Pharmacist 02/19/2018,9:33 PM

## 2018-02-19 NOTE — Progress Notes (Signed)
Nutrition Follow Up Note   DOCUMENTATION CODES:   Not applicable  INTERVENTION:   Change to Clinimix 5/15 with electrolytes at 2840ml/hr (goal rate 7083ml/hr when labs stabilize)   20% lipids @25ml /hr x 12 hrs/day    Regimen at goal provides 2015 kcal/day, 100g/day protein, 2292ml volume   Daily weights  MVI daily in TPN  Trace elements daily in TPN  Continue IVF @85ml /hr  Monitor K, Mg, and P until labs stable   NUTRITION DIAGNOSIS:   Inadequate oral intake related to acute illness as evidenced by NPO status.   GOAL:   Patient will meet greater than or equal to 90% of their needs  MONITOR:   Diet advancement, Labs, Weight trends, I & O's, Other (Comment)(TPN)  ASSESSMENT:   52 yo male re-admitted 2/27 for ex lap, abdominal pain and emesis secondary to small bowel obstruction secondary to intestinal adhesions. Acute tachycardia.  PMH traumatic brain injury  3/1 surgery performed: ex lap, extensive lysis of adhesions with small bowel resection, repair serosal injuries, appendectomy.   Pt with low phosphorus today; continue TPN at 1/2 rate. Can increase to goal rate when labs stable  Medications reviewed and include: insulin, protonix, zosyn, NaCl @85ml /hr  Labs reviewed: K 3.4(L), P 1.3(L), Mg 2.0 wnl Triglycerides- 46 cbgs- 113, 106 x 24hrs  Diet Order:  .TPN (CLINIMIX-E) Adult Diet NPO time specified Except for: Ice Chips  EDUCATION NEEDS:   Education needs have been addressed  Skin: Incisions: Closed abdominal incision  Last BM:  No BM since admission; last BM reported by Pt PTA 2/26  Height:   Ht Readings from Last 1 Encounters:  02/17/18 5\' 9"  (1.753 m)    Weight:   Wt Readings from Last 1 Encounters:  02/19/18 162 lb 7.7 oz (73.7 kg)    Ideal Body Weight:  72.7 kg  BMI:  Body mass index is 23.99 kg/m.  Estimated Nutritional Needs:   Kcal:  1,850- 2,150 kcal/day  Protein:  92-106 grams  Fluid:  > 1.9 L/day  Mike Holidayasey Matrice Herro MS, RD,  LDN Pager #- [phone] After Hours Pager: [phone]

## 2018-02-19 NOTE — Progress Notes (Signed)
02/19/2018  Subjective: Patient is 2 Days Post-Op s/p exlap, extensive lysis of adhesions, small bowel resection, and appendectomy.  No acute events.  Patient's tachycardia improved.  Pain doing well and is controlled.  No bowel function yet.  Started on TPN last night.  Foley catheter removed yesterday and patient voiding well.  Vital signs: Temp:  [98.3 F (36.8 C)-98.6 F (37 C)] 98.6 F (37 C) (03/02 0548) Pulse Rate:  [96-106] 96 (03/02 0548) Resp:  [18-21] 20 (03/02 0548) BP: (106-139)/(70-93) 138/93 (03/02 0548) SpO2:  [97 %-98 %] 97 % (03/02 0548) Weight:  [73.7 kg (162 lb 7.7 oz)-74.5 kg (164 lb 3.9 oz)] 73.7 kg (162 lb 7.7 oz) (03/02 0548)   Intake/Output: 03/01 0701 - 03/02 0700 In: 3062 [I.V.:2872; NG/GT:40; IV Piggyback:150] Out: 2605 [Urine:1235; Emesis/NG output:1100; Drains:270] Last BM Date: 02/02/18(per pt. report)  Physical Exam: Constitutional: No acute distress Abdomen:  Soft, nondistended, appropriately tender to palpation.  Midline incision clean, dry, intact with staples in place.  Bilateral JP drains with serosanguinous fluid.  NG tube in place with bilious drainage.  Labs:  Recent Labs    02/18/18 0428 02/19/18 0457  WBC 4.7 10.1  HGB 12.9* 10.3*  HCT 38.3* 30.3*  PLT 276 244   Recent Labs    02/18/18 0428 02/19/18 0638  NA 141 136  K 4.2 3.4*  CL 108 105  CO2 25 24  GLUCOSE 113* 106*  BUN 10 11  CREATININE 0.86 0.65  CALCIUM 7.8* 7.7*   Recent Labs    02/17/18 0318  LABPROT 12.4  INR 0.93    Assessment/Plan: 52 yo male s/p exlap, lysis of adhesions, small bowel resection, and appendectomy  --Continue NPO with NG tube to suction.  Expecting prolonged post-op ileus and patient is aware the NG tube will remain in place until bowel function returns. --continue IV fluids and TPN per pharmacy.  May adjust to advance to goal rate per pharmacy.   --OOB, ambulate   Mike IllJose Luis Myliah Medel, MD [facility]
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
## 2018-02-20 LAB — COMPREHENSIVE METABOLIC PANEL
ALBUMIN: 2.3 g/dL — AB (ref 3.5–5.0)
ALT: 20 U/L (ref 17–63)
ANION GAP: 7 (ref 5–15)
AST: 29 U/L (ref 15–41)
Alkaline Phosphatase: 52 U/L (ref 38–126)
BUN: 7 mg/dL (ref 6–20)
CHLORIDE: 107 mmol/L (ref 101–111)
CO2: 25 mmol/L (ref 22–32)
Calcium: 7.9 mg/dL — ABNORMAL LOW (ref 8.9–10.3)
Creatinine, Ser: 0.59 mg/dL — ABNORMAL LOW (ref 0.61–1.24)
GFR calc Af Amer: >60 mL/min (ref >60)
GFR calc non Af Amer: >60 mL/min (ref >60)
Glucose, Bld: 93 mg/dL (ref 65–99)
POTASSIUM: 3.2 mmol/L — AB (ref 3.5–5.1)
SODIUM: 139 mmol/L (ref 135–145)
Total Bilirubin: 0.4 mg/dL (ref 0.3–1.2)
Total Protein: 5.6 g/dL — ABNORMAL LOW (ref 6.5–8.1)

## 2018-02-20 LAB — GLUCOSE, CAPILLARY
GLUCOSE-CAPILLARY: 86 mg/dL (ref 65–99)
GLUCOSE-CAPILLARY: 77 mg/dL (ref 65–99)
GLUCOSE-CAPILLARY: 92 mg/dL (ref 65–99)

## 2018-02-20 LAB — PHOSPHORUS: Phosphorus: 2.1 mg/dL — ABNORMAL LOW (ref 2.5–4.6)

## 2018-02-20 LAB — MAGNESIUM: Magnesium: 1.9 mg/dL (ref 1.7–2.4)

## 2018-02-20 MED ORDER — POTASSIUM PHOSPHATES 15 MMOLE/5ML IV SOLN
20.0000 mmol | Freq: Once | INTRAVENOUS | Status: AC
  Administered 2018-02-20: 20 mmol via INTRAVENOUS
  Filled 2018-02-20: qty 6.67

## 2018-02-20 MED ORDER — TRACE MINERALS CR-CU-MN-SE-ZN 10-1000-500-60 MCG/ML IV SOLN
INTRAVENOUS | Status: AC
  Administered 2018-02-20: 19:00:00 via INTRAVENOUS
  Filled 2018-02-20: qty 1992

## 2018-02-20 MED ORDER — FAT EMULSION 20 % IV EMUL
300.0000 mL | INTRAVENOUS | Status: AC
  Administered 2018-02-20: 300 mL via INTRAVENOUS
  Filled 2018-02-20: qty 300

## 2018-02-20 NOTE — Progress Notes (Signed)
02/20/2018  Subjective: Patient is 3 Days Post-Op s/p exlap, extensive LOA, SBR, and appendectomy.  No acute events.  Patient has been OOB and ambulating.  Pain is well controlled.  No flatus yet.  NG tube in place and working.  Vital signs: Temp:  [98.1 F (36.7 C)-98.2 F (36.8 C)] 98.2 F (36.8 C) (03/03 1125) Pulse Rate:  [90-98] 98 (03/03 1125) Resp:  [20] 20 (03/03 0601) BP: (121-132)/(72-90) 121/72 (03/03 1125) SpO2:  [99 %-100 %] 100 % (03/03 1125) Weight:  [72.4 kg (159 lb 9.6 oz)] 72.4 kg (159 lb 9.6 oz) (03/03 0500)   Intake/Output: 03/02 0701 - 03/03 0700 In: 4495 [I.V.:4298; IV Piggyback:197] Out: [id number] [Urine:3575; Emesis/NG output:1150; Drains:170] Last BM Date: 02/16/18  Physical Exam: Constitutional: No acute distress Abdomen:  Soft, nondistended, appropriately tender to palpation.  Midline incision is clean, dry, intact with staples in place.  Dressings removed.  JP drains with serosanguinous fluid in bulbs.  Labs:  Recent Labs    02/18/18 0428 02/19/18 0457  WBC 4.7 10.1  HGB 12.9* 10.3*  HCT 38.3* 30.3*  PLT 276 244   Recent Labs    02/19/18 0638 02/19/18 2005 02/20/18 0548  NA 136  --  139  K 3.4* 3.9 3.2*  CL 105  --  107  CO2 24  --  25  GLUCOSE 106*  --  93  BUN 11  --  7  CREATININE 0.65  --  0.59*  CALCIUM 7.7*  --  7.9*   No results for input(s): LABPROT, INR in the last 72 hours.  Imaging: No results found.  Assessment/Plan: 52 yo male s/p exlap, LOA, SBR, and appy  --Continue NPO and NG tube to suction, while awaiting return of bowel function. --Continue TPN and advance per pharmacy. --continue ambulation, pain control, dvt proph, GI proph.   Mike IllJose Luis Derec Mozingo, MD [facility]

## 2018-02-20 NOTE — Progress Notes (Signed)
Walking in halls a lot, independently.  NG draining dark green liq. Stable.

## 2018-02-20 NOTE — Consult Note (Addendum)
PHARMACY - ADULT TOTAL PARENTERAL NUTRITION CONSULT NOTE   Pharmacy Consult for TPN Indication: Prolonged Ileus  Patient Measurements: Height: 5\' 9"  (175.3 cm) Weight: 159 lb 9.6 oz (72.4 kg) IBW/kg (Calculated) : 70.7 TPN AdjBW (KG): 70.5 Body mass index is 23.57 kg/m. Usual Weight:   Assessment:   GI:  Endo:  Insulin requirements in the past 24 hours: 0 Lytes: Kim = 3.2, Mg = 1.9, phos = 2.1 Renal: crcl 110.115ml/min Pulm: Cards:  Hepatobil: Neuro: ID: zosyn 3.375g q 8hr  TPN Access: PICC ordered 3/1 TPN start date: Nutritional Goals (per RD recommendation on ): kCal: Protein:  Fluid:  Goal TPN rate is 85 ml/hr (provides 2235 kcal/day, 100g/day protein, 2232ml volume)  Current Nutrition:   Plan:  Clinimix E 5/15 at 83 mL/hr. Lipids @ 25 ml/hr x 12 hr Add MVI, trace elements to TPN Sensitive SSI q 6 hr and adjust as needed NS @ 60 ml/hr currently  3/2 TG=46 CBC w/ diff, TG, CMP weekly Daily weights, ins and outs   3/3  Kim = 3.2, Mag = 1.9, Phos = 2.1. Ordered Potassium Phosphate 20 mmol IV x 1 dose.  Recheck electrolytes with am labs   Mike Kim,Mike Kim, [facility] Clinical Pharmacist 02/20/2018,10:34 AM

## 2018-02-21 LAB — CBC
HCT: 28.8 % — ABNORMAL LOW (ref 40.0–52.0)
Hemoglobin: 9.8 g/dL — ABNORMAL LOW (ref 13.0–18.0)
MCH: 30.3 pg (ref 26.0–34.0)
MCHC: 33.9 g/dL (ref 32.0–36.0)
MCV: 89.5 fL (ref 80.0–100.0)
PLATELETS: 243 10*3/uL (ref 150–440)
RBC: 3.22 MIL/uL — ABNORMAL LOW (ref 4.40–5.90)
RDW: 14.1 % (ref 11.5–14.5)
WBC: 7.7 10*3/uL (ref 3.8–10.6)

## 2018-02-21 LAB — DIFFERENTIAL
BASOS PCT: 0 %
Basophils Absolute: 0 10*3/uL (ref 0–0.1)
EOS ABS: 0.2 10*3/uL (ref 0–0.7)
EOS PCT: 3 %
Lymphocytes Relative: 13 %
Lymphs Abs: 1 10*3/uL (ref 1.0–3.6)
MONO ABS: 0.8 10*3/uL (ref 0.2–1.0)
Monocytes Relative: 11 %
Neutro Abs: 5.6 10*3/uL (ref 1.4–6.5)
Neutrophils Relative %: 73 %

## 2018-02-21 LAB — GLUCOSE, CAPILLARY
GLUCOSE-CAPILLARY: 69 mg/dL (ref 65–99)
GLUCOSE-CAPILLARY: 96 mg/dL (ref 65–99)
Glucose-Capillary: 91 mg/dL (ref 65–99)
Glucose-Capillary: 113 mg/dL — ABNORMAL HIGH (ref 65–99)
Glucose-Capillary: 70 mg/dL (ref 65–99)
Glucose-Capillary: 99 mg/dL (ref 65–99)

## 2018-02-21 LAB — COMPREHENSIVE METABOLIC PANEL
ALBUMIN: 2.3 g/dL — AB (ref 3.5–5.0)
ALK PHOS: 52 U/L (ref 38–126)
ALT: 19 U/L (ref 17–63)
ANION GAP: 10 (ref 5–15)
AST: 23 U/L (ref 15–41)
BILIRUBIN TOTAL: 0.4 mg/dL (ref 0.3–1.2)
BUN: 9 mg/dL (ref 6–20)
CALCIUM: 8.2 mg/dL — AB (ref 8.9–10.3)
CO2: 22 mmol/L (ref 22–32)
Chloride: 107 mmol/L (ref 101–111)
Creatinine, Ser: 0.63 mg/dL (ref 0.61–1.24)
GFR calc non Af Amer: >60 mL/min (ref >60)
GLUCOSE: 88 mg/dL (ref 65–99)
Potassium: 3 mmol/L — ABNORMAL LOW (ref 3.5–5.1)
Sodium: 139 mmol/L (ref 135–145)
TOTAL PROTEIN: 5.9 g/dL — AB (ref 6.5–8.1)

## 2018-02-21 LAB — SURGICAL PATHOLOGY

## 2018-02-21 LAB — PHOSPHORUS: Phosphorus: 3.4 mg/dL (ref 2.5–4.6)

## 2018-02-21 LAB — MAGNESIUM: Magnesium: 2 mg/dL (ref 1.7–2.4)

## 2018-02-21 LAB — POTASSIUM: Potassium: 3.8 mmol/L (ref 3.5–5.1)

## 2018-02-21 MED ORDER — M.V.I. ADULT IV INJ
INJECTION | INTRAVENOUS | Status: AC
  Administered 2018-02-21: 18:00:00 via INTRAVENOUS
  Filled 2018-02-21: qty 1992

## 2018-02-21 MED ORDER — POTASSIUM CHLORIDE 10 MEQ/100ML IV SOLN
10.0000 meq | INTRAVENOUS | Status: AC
  Administered 2018-02-21 (×4): 10 meq via INTRAVENOUS
  Filled 2018-02-21 (×4): qty 100

## 2018-02-21 MED ORDER — FAT EMULSION 20 % IV EMUL
300.0000 mL | INTRAVENOUS | Status: AC
  Administered 2018-02-21: 300 mL via INTRAVENOUS
  Filled 2018-02-21: qty 300

## 2018-02-21 NOTE — Progress Notes (Signed)
POD # 4 Pain well controlled Ambulated No flatus Still high output NGT AVSS  PE NAD Abd: incision c/d/i, drains w serous output. Decrease bs. No peritonitis  A/P Doing well Awaiting return of bowel fx Keep ngt, tpn

## 2018-02-21 NOTE — Progress Notes (Signed)
Nutrition Follow Up Note   DOCUMENTATION CODES:   Not applicable  INTERVENTION:   Continue Clinimix 5/15 with electrolytes at goal rate of 7083ml/hr until pt able to tolerate full liquid diet.    Continue 20% lipids @25ml /hr x 12 hrs/day    Regimen at goal provides 2015 kcal/day, 100g/day protein, 2292ml volume   Daily weights  Continue MVI daily in TPN  Continue trace elements daily in TPN  IVF plus TPN to remain at total rate of 18225ml/hr per surgery   Monitor K, Mg, and P until labs stable   NUTRITION DIAGNOSIS:   Inadequate oral intake related to acute illness as evidenced by NPO status.   GOAL:   Patient will meet greater than or equal to 90% of their needs  MONITOR:   Diet advancement, Labs, Weight trends, I & O's, Other (Comment)(TPN)  ASSESSMENT:   52 yo male re-admitted 2/27 for ex lap, abdominal pain and emesis secondary to small bowel obstruction secondary to intestinal adhesions. Acute tachycardia.  PMH traumatic brain injury  3/1 surgery performed: ex lap, extensive lysis of adhesions with small bowel resection, repair serosal injuries, appendectomy.   Pt tolerating TPN at goal rate. Recommend continue TPN until pt is able to tolerate full liquid diet. Per chart, pt up 4lbs from admit weight. Pt continues to be NPO. NGT in place with 950ml output x 24hrs.    Medications reviewed and include: lovenox,  insulin, protonix, zosyn, NaCl @42ml /hr, KCl  Labs reviewed: K 3.0(L), Ca 8.2(L) adj. 9.56 wnl, P 3.4 wnl, Mg 2.0 wnl, alb 2.3(L) Triglycerides- 46- 3/2 Hgb 9.8(L), Hct 28.8(L) cbgs- 93, 88 x 24hrs  Diet Order:  Diet NPO time specified Except for: Ice Chips .TPN (CLINIMIX-E) Adult .TPN (CLINIMIX-E) Adult  EDUCATION NEEDS:   Education needs have been addressed  Skin: Incisions: Closed abdominal incision  Last BM:  No BM since admission; last BM reported by Pt PTA 2/26  Height:   Ht Readings from Last 1 Encounters:  02/17/18 5\' 9"  (1.753 m)     Weight:   Wt Readings from Last 1 Encounters:  02/20/18 159 lb 9.6 oz (72.4 kg)    Ideal Body Weight:  72.7 kg  BMI:  Body mass index is 23.57 kg/m.  Estimated Nutritional Needs:   Kcal:  1,850- 2,150 kcal/day  Protein:  92-106 grams  Fluid:  > 1.9 L/day  Betsey Holidayasey Aubryana Vittorio MS, RD, LDN Pager #- [phone] After Hours Pager: [phone]

## 2018-02-21 NOTE — Consult Note (Addendum)
PHARMACY - ADULT TOTAL PARENTERAL NUTRITION CONSULT NOTE   Pharmacy Consult for TPN Indication: Prolonged Ileus  Patient Measurements: Height: 5\' 9"  (175.3 cm) Weight: 159 lb 9.6 oz (72.4 kg) IBW/kg (Calculated) : 70.7 TPN AdjBW (KG): 70.5 Body mass index is 23.57 kg/m. Usual Weight:   Assessment:   GI:  Endo:  Insulin requirements in the past 24 hours: 0 Lytes: K = 3.0, Mg = 2.0, phos = 3.4  Ca 8.2  Albumin 2.3 Renal: crcl 110.755ml/min Pulm: Cards:  Hepatobil: Neuro: ID: zosyn 3.375g q 8hr  TPN Access: PICC ordered 3/1 TPN start date: Nutritional Goals (per RD recommendation on ): kCal: Protein:  Fluid:  Goal TPN rate is 85 ml/hr (provides 2235 kcal/day, 100g/day protein, 2232ml volume)  Current Nutrition:   Plan:  Clinimix E 5/15 at 83 mL/hr. Lipids @ 25 ml/hr x 12 hr Add MVI, trace elements to TPN Sensitive SSI q 6 hr and adjust as needed. NPO. IVF: NS @ 60 ml/hr currently. Max volume 15925ml/hr per surgery- therefore when TPN starts will decrease to IVF 4785ml/hr- will continue to adjust for Kim total volume of 16625ml/hr  3/3  K = 3.2, Mag = 1.9, Phos = 2.1. Ordered Potassium Phosphate 20 mmol IV x 1 dose.  Recheck electrolytes with am labs   3/4  K 3.0, Mag 2.0, Phos 3.4.  Will order KCL IV 10meq x 4 doses(40 meq total).  F/u K= level at 1800. F/u electrolytes in am. Continue current TPN and rate.  Will adjust IVF to 42 ml/hr for total Volume of 125 ml/hr for TPN and IV fluids.  Mike Kim,Mike Kim, [facility] HospitalRPH Clinical Pharmacist 02/21/2018,7:39 AM

## 2018-02-21 NOTE — Progress Notes (Signed)
Notified MD pt pull his NG tube while he was sleeping. Per MD okay for RN no to put it back on.

## 2018-02-21 NOTE — Consult Note (Addendum)
PHARMACY - ADULT TOTAL PARENTERAL NUTRITION CONSULT NOTE   Assessment:  Lab Results  Component Value Date   K 3.8 02/21/2018   Plan:  3/4 PM: K+ is WNL. No additional potassium replacement needed this evening. Will recheck electrolytes with AM labs and continue to replace as needed.   Gardner CandleSheema M Preslea Rhodus, PharmD, BCPS Clinical Pharmacist 02/21/2018 6:59 PM

## 2018-02-21 NOTE — Progress Notes (Signed)
Pharmacy Antibiotic Note  Mike Kim is a 52 y.o. male admitted on 02/16/2018 with intra abdominal infection.  Pharmacy has been consulted for zosyn dosing.  Post-op Day 3 bowel surgery, NPO  Plan:  Day 5- Zosyn 3.375g IV q8h (4 hour infusion).    Height: 5\' 9"  (175.3 cm) Weight: 159 lb 9.6 oz (72.4 kg) IBW/kg (Calculated) : 70.7  Temp (24hrs), Avg:98.2 F (36.8 C), Min:98.1 F (36.7 C), Max:98.2 F (36.8 C)  Recent Labs  Lab 02/16/18 0528 02/17/18 0318 02/18/18 0428 02/19/18 0457 02/19/18 0638 02/20/18 0548 02/21/18 0354  WBC 13.9* 6.5 4.7 10.1  --   --  7.7  CREATININE 0.86 0.76 0.86  --  0.65 0.59* 0.63    Estimated Creatinine Clearance: 110.5 mL/min (by C-G formula based on SCr of 0.63 mg/dL).    No Known Allergies  Antimicrobials this admission: Anti-infectives (From admission, onward)   Start     Dose/Rate Route Frequency Ordered Stop   02/17/18 2200  piperacillin-tazobactam (ZOSYN) IVPB 3.375 g     3.375 g 12.5 mL/hr over 240 Minutes Intravenous Every 8 hours 02/17/18 2102     02/17/18 1400  ertapenem (INVANZ) 1 g in sodium chloride 0.9 % 100 mL IVPB     1 g 200 mL/hr over 30 Minutes Intravenous  Once 02/17/18 1346 02/18/18 0130   02/17/18 0900  ceFAZolin (ANCEF) IVPB 2g/100 mL premix     2 g 200 mL/hr over 30 Minutes Intravenous On call to O.R. 02/17/18 0811 02/17/18 1844       Microbiology results: Recent Results (from the past 240 hour(s))  CULTURE, BLOOD (ROUTINE X 2) w Reflex to ID Panel     Status: None (Preliminary result)   Collection Time: 02/18/18  4:29 AM  Result Value Ref Range Status   Specimen Description BLOOD LEFT ANTECUBITAL  Final   Special Requests   Final    BOTTLES DRAWN AEROBIC AND ANAEROBIC Blood Culture adequate volume   Culture   Final    NO GROWTH 3 DAYS Performed at [facility], [address] [place], [place] [address]    Report Status PENDING  Incomplete  CULTURE, BLOOD (ROUTINE X 2) w Reflex to ID  Panel     Status: None (Preliminary result)   Collection Time: 02/18/18  4:35 AM  Result Value Ref Range Status   Specimen Description BLOOD BLOOD LEFT HAND  Final   Special Requests   Final    BOTTLES DRAWN AEROBIC AND ANAEROBIC Blood Culture adequate volume   Culture   Final    NO GROWTH 3 DAYS Performed at [facility], [address] [place], [place] [address]    Report Status PENDING  Incomplete    Zosyn 2/28 >>  Thank you for allowing pharmacy to be a part of this patient's care.  Yariah Selvey A 02/21/2018 7:50 AM

## 2018-02-22 ENCOUNTER — Inpatient Hospital Stay: Payer: BLUE CROSS/BLUE SHIELD

## 2018-02-22 LAB — BASIC METABOLIC PANEL
Anion gap: 9 (ref 5–15)
BUN: 12 mg/dL (ref 6–20)
CALCIUM: 8.2 mg/dL — AB (ref 8.9–10.3)
CO2: 22 mmol/L (ref 22–32)
Chloride: 106 mmol/L (ref 101–111)
Creatinine, Ser: 0.51 mg/dL — ABNORMAL LOW (ref 0.61–1.24)
GFR calc non Af Amer: >60 mL/min (ref >60)
Glucose, Bld: 80 mg/dL (ref 65–99)
Potassium: 3.5 mmol/L (ref 3.5–5.1)
SODIUM: 137 mmol/L (ref 135–145)

## 2018-02-22 LAB — GLUCOSE, CAPILLARY
GLUCOSE-CAPILLARY: 96 mg/dL (ref 65–99)
Glucose-Capillary: 77 mg/dL (ref 65–99)
Glucose-Capillary: 96 mg/dL (ref 65–99)

## 2018-02-22 LAB — CBC
HCT: 30.4 % — ABNORMAL LOW (ref 40.0–52.0)
HEMOGLOBIN: 10.4 g/dL — AB (ref 13.0–18.0)
MCH: 30.6 pg (ref 26.0–34.0)
MCHC: 34.4 g/dL (ref 32.0–36.0)
MCV: 89 fL (ref 80.0–100.0)
Platelets: 292 10*3/uL (ref 150–440)
RBC: 3.41 MIL/uL — ABNORMAL LOW (ref 4.40–5.90)
RDW: 13.7 % (ref 11.5–14.5)
WBC: 10.1 10*3/uL (ref 3.8–10.6)

## 2018-02-22 MED ORDER — FAT EMULSION 20 % IV EMUL
300.0000 mL | INTRAVENOUS | Status: AC
  Administered 2018-02-22: 300 mL via INTRAVENOUS
  Filled 2018-02-22: qty 300

## 2018-02-22 MED ORDER — M.V.I. ADULT IV INJ
INJECTION | INTRAVENOUS | Status: AC
  Administered 2018-02-22: 19:00:00 via INTRAVENOUS
  Filled 2018-02-22: qty 1992

## 2018-02-22 MED ORDER — ACETAMINOPHEN 10 MG/ML IV SOLN
1000.0000 mg | Freq: Four times a day (QID) | INTRAVENOUS | Status: AC
  Administered 2018-02-22 – 2018-02-23 (×4): 1000 mg via INTRAVENOUS
  Filled 2018-02-22 (×4): qty 100

## 2018-02-22 MED ORDER — KETOROLAC TROMETHAMINE 30 MG/ML IJ SOLN
30.0000 mg | Freq: Four times a day (QID) | INTRAMUSCULAR | Status: DC
  Administered 2018-02-22 – 2018-02-26 (×17): 30 mg via INTRAVENOUS
  Filled 2018-02-22 (×17): qty 1

## 2018-02-22 NOTE — Progress Notes (Signed)
POD # 5 NGT came out yesterday accidentally HE has more pain this am and some nausea.no flatus KUB persistent ileus AVSS Creat ok  PE NAD Abd: soft, decrease bs, staples in place, mild surrounding erythema, drain serous output  A/P  Persistent ileus Continue NPO May need NGT again Mobilize Continue TPN

## 2018-02-22 NOTE — Consult Note (Signed)
PHARMACY - ADULT TOTAL PARENTERAL NUTRITION CONSULT NOTE   Pharmacy Consult for TPN Indication: Prolonged Ileus  Patient Measurements: Height: 5\' 9"  (175.3 cm) Weight: 158 lb 11.7 oz (72 kg) IBW/kg (Calculated) : 70.7 TPN AdjBW (KG): 70.5 Body mass index is 23.44 kg/m. Usual Weight:   Assessment:   GI:  Endo:  Insulin requirements in the past 24 hours: 0 Lytes: K = 3.5,  3/4: Mg = 2.0, phos = 3.4  Ca 8.2  Albumin 2.3 Renal: crcl 110.245ml/min Pulm: Cards:  Hepatobil: Neuro: ID: zosyn 3.375g q 8hr  TPN Access: PICC ordered 3/1 TPN start date: Nutritional Goals (per RD recommendation on ): kCal: Protein:  Fluid:  Goal TPN rate is 83 ml/hr (provides 2235 kcal/day, 100g/day protein, 2232ml volume)  Current Nutrition:   Plan:  Clinimix E 5/15 at 83 mL/hr. Lipids @ 25 ml/hr x 12 hr Add MVI, trace elements to TPN Sensitive SSI q 6 hr and adjust as needed. NPO. IVF: NS @ 60 ml/hr currently. Max volume 11325ml/hr per surgery- therefore when TPN starts will decrease to IVF 885ml/hr- will continue to adjust for a total volume of 12825ml/hr  3/3  K = 3.2, Mag = 1.9, Phos = 2.1. Ordered Potassium Phosphate 20 mmol IV x 1 dose.  Recheck electrolytes with am labs   3/4  K 3.0, Mag 2.0, Phos 3.4.  Will order KCL IV 10meq x 4 doses(40 meq total).  F/u K= level at 1800. F/u electrolytes in am. Continue current TPN and rate.  Will adjust IVF to 42 ml/hr for total Volume of 125 ml/hr for TPN and IV fluids together.  3/5: K 3.5.  F/u K+ with am labs.  IVF at 42 ml/hr.  Continue current TPN/rate. Continues NPO  Mike Kim A, [facility] HospitalRPH Clinical Pharmacist 02/22/2018,9:49 AM

## 2018-02-23 LAB — CULTURE, BLOOD (ROUTINE X 2)
CULTURE: NO GROWTH
Culture: NO GROWTH
Special Requests: ADEQUATE
Special Requests: ADEQUATE

## 2018-02-23 LAB — GLUCOSE, CAPILLARY
GLUCOSE-CAPILLARY: 103 mg/dL — AB (ref 65–99)
Glucose-Capillary: 102 mg/dL — ABNORMAL HIGH (ref 65–99)
Glucose-Capillary: 80 mg/dL (ref 65–99)
Glucose-Capillary: 88 mg/dL (ref 65–99)
Glucose-Capillary: 92 mg/dL (ref 65–99)

## 2018-02-23 LAB — POTASSIUM: Potassium: 3.8 mmol/L (ref 3.5–5.1)

## 2018-02-23 LAB — PHOSPHORUS: Phosphorus: 4 mg/dL (ref 2.5–4.6)

## 2018-02-23 LAB — MAGNESIUM: MAGNESIUM: 2.1 mg/dL (ref 1.7–2.4)

## 2018-02-23 MED ORDER — M.V.I. ADULT IV INJ
INTRAVENOUS | Status: AC
  Administered 2018-02-23: 18:00:00 via INTRAVENOUS
  Filled 2018-02-23: qty 1992

## 2018-02-23 MED ORDER — OXYCODONE HCL 5 MG PO TABS
5.0000 mg | ORAL_TABLET | ORAL | Status: DC | PRN
  Administered 2018-02-23 – 2018-02-25 (×6): 5 mg via ORAL
  Filled 2018-02-23 (×6): qty 1

## 2018-02-23 MED ORDER — FAT EMULSION 20 % IV EMUL
250.0000 mL | INTRAVENOUS | Status: AC
  Administered 2018-02-23: 250 mL via INTRAVENOUS
  Filled 2018-02-23: qty 250

## 2018-02-23 NOTE — Plan of Care (Signed)
Pts pain continues to improve. Ambulated multiple times around the nurses station

## 2018-02-23 NOTE — Consult Note (Signed)
PHARMACY - ADULT TOTAL PARENTERAL NUTRITION CONSULT NOTE   Pharmacy Consult for TPN Indication: Prolonged Ileus  Patient Measurements: Height: 5\' 9"  (175.3 cm) Weight: 160 lb 0.9 oz (72.6 kg) IBW/kg (Calculated) : 70.7 TPN AdjBW (KG): 70.5 Body mass index is 23.64 kg/m. Usual Weight:   Assessment:   GI:  Endo:  Insulin requirements in the past 24 hours: 0 Lytes: K = 3.5,  3/4: Mg = 2.0, phos = 3.4  Ca 8.2  Albumin 2.3 Renal: crcl 110.605ml/min Pulm: Cards:  Hepatobil: Neuro: ID: zosyn 3.375g q 8hr  TPN Access: PICC ordered 3/1 TPN start date: Nutritional Goals (per RD recommendation on ): kCal: Protein:  Fluid:  Goal TPN rate is 83 ml/hr (provides 2235 kcal/day, 100g/day protein, 2232ml volume)  Current Nutrition:   Plan:  Clinimix E 5/15 at 83 mL/hr. Lipids @ 25 ml/hr x 12 hr Add MVI, trace elements to TPN Sensitive SSI q 6 hr and adjust as needed. NPO. IVF: NS @ 60 ml/hr currently. Max volume 17125ml/hr per surgery- therefore when TPN starts will decrease to IVF 7985ml/hr- will continue to adjust for a total volume of 11825ml/hr  3/3  K = 3.2, Mag = 1.9, Phos = 2.1. Ordered Potassium Phosphate 20 mmol IV x 1 dose.  Recheck electrolytes with am labs   3/4  K 3.0, Mag 2.0, Phos 3.4.  Will order KCL IV 10meq x 4 doses(40 meq total).  F/u K= level at 1800. F/u electrolytes in am. Continue current TPN and rate.  Will adjust IVF to 42 ml/hr for total Volume of 125 ml/hr for TPN and IV fluids together.  3/5: K 3.5.  F/u K+ with am labs.  IVF at 42 ml/hr.  Continue current TPN/rate. Continues NPO  3/6: K 3.8. F/u with am LABS.   Continue current TPN/rate. Plan is to reduce 3/7 per Dr Everlene FarrierPabon note.  Gerre PebblesGarrett Vidyuth Belsito, [facility] CenterRPH Clinical Pharmacist 02/23/2018,3:35 PM

## 2018-02-23 NOTE — Progress Notes (Signed)
POD # 6 Passed flatus yesterday No emesis Pain better controlled AVSS  PE: NAD, decrease bs, incision c/d/i. Drains w serous output  A/P Doing well Resolving ileus Clears , keep TPN for today, may start weaning tomorrow

## 2018-02-24 LAB — GLUCOSE, CAPILLARY
Glucose-Capillary: 72 mg/dL (ref 65–99)
Glucose-Capillary: 79 mg/dL (ref 65–99)
Glucose-Capillary: 86 mg/dL (ref 65–99)

## 2018-02-24 LAB — COMPREHENSIVE METABOLIC PANEL
ALK PHOS: 100 U/L (ref 38–126)
ALT: 34 U/L (ref 17–63)
AST: 32 U/L (ref 15–41)
Albumin: 2.5 g/dL — ABNORMAL LOW (ref 3.5–5.0)
Anion gap: 8 (ref 5–15)
BILIRUBIN TOTAL: 0.6 mg/dL (ref 0.3–1.2)
BUN: 12 mg/dL (ref 6–20)
CHLORIDE: 107 mmol/L (ref 101–111)
CO2: 26 mmol/L (ref 22–32)
CREATININE: 0.76 mg/dL (ref 0.61–1.24)
Calcium: 8.9 mg/dL (ref 8.9–10.3)
GFR calc Af Amer: >60 mL/min (ref >60)
Glucose, Bld: 91 mg/dL (ref 65–99)
Potassium: 4.4 mmol/L (ref 3.5–5.1)
Sodium: 141 mmol/L (ref 135–145)
Total Protein: 6.7 g/dL (ref 6.5–8.1)

## 2018-02-24 LAB — PHOSPHORUS: Phosphorus: 3.9 mg/dL (ref 2.5–4.6)

## 2018-02-24 LAB — MAGNESIUM: MAGNESIUM: 2 mg/dL (ref 1.7–2.4)

## 2018-02-24 MED ORDER — FAT EMULSION 20 % IV EMUL
250.0000 mL | INTRAVENOUS | Status: AC
  Administered 2018-02-24: 250 mL via INTRAVENOUS
  Filled 2018-02-24 (×2): qty 250

## 2018-02-24 MED ORDER — TRACE MINERALS CR-CU-MN-SE-ZN 10-1000-500-60 MCG/ML IV SOLN: INTRAVENOUS | Status: DC

## 2018-02-24 MED ORDER — FAT EMULSION 20 % IV EMUL: 250.0000 mL | INTRAVENOUS | Status: DC

## 2018-02-24 MED ORDER — TRACE MINERALS CR-CU-MN-SE-ZN 10-1000-500-60 MCG/ML IV SOLN
INTRAVENOUS | Status: DC
  Administered 2018-02-24: 18:00:00 via INTRAVENOUS
  Filled 2018-02-24: qty 960

## 2018-02-24 MED ORDER — ACETAMINOPHEN 325 MG PO TABS
650.0000 mg | ORAL_TABLET | Freq: Four times a day (QID) | ORAL | Status: DC | PRN
  Administered 2018-02-24 – 2018-02-25 (×4): 650 mg via ORAL
  Filled 2018-02-24 (×4): qty 2

## 2018-02-24 NOTE — Progress Notes (Signed)
Nutrition Follow Up Note   DOCUMENTATION CODES:   Not applicable  INTERVENTION:   Decrease Clinimix 5/15 with electrolytes to 42m/hr. Plan to discontinue 3/8   Continue 20% lipids _0 /hr x 12 hrs/day    Regimen at goal provides 2015 kcal/day, 100g/day protein, 22964mvolume   Daily weights  Continue MVI daily in TPN  Continue trace elements daily in TPN  NUTRITION DIAGNOSIS:   Inadequate oral intake related to acute illness as evidenced by NPO status.  GOAL:   Patient will meet greater than or equal to 90% of their needs  -met with TPN  MONITOR:   PO intake, Supplement acceptance, Labs, Weight trends, I & O's  ASSESSMENT:   [age]o male re-admitted 2/27 for ex lap, abdominal pain and emesis secondary to small bowel obstruction secondary to intestinal adhesions. Acute tachycardia.  PMH traumatic brain injury  3/1 surgery performed: ex lap, extensive lysis of adhesions with small bowel resection, repair serosal injuries, appendectomy.   Pt tolerating TPN well. Pt advanced to full liquids 3/6; eating 100%. Pt advanced to regular diet today. Per surgery MD, will 1/2 TPN rate today and discontinue tomorrow. Per chart, pt is weight stable since admit. Pt passing flatus.     Medications reviewed and include: lovenox,  insulin, protonix, zosyn, NaCl _1 /hr, KCl  Labs reviewed: K 4.4 wnl, P 3.9 wnl, 2.0 wnl Triglycerides- 46- 3/2 cbgs- 80, 91 x 24hrs  Diet Order:  TPN (CLINIMIX-E) Adult Diet regular Room service appropriate? Yes; Fluid consistency: Thin  EDUCATION NEEDS:   Education needs have been addressed  Skin: Incisions: Closed abdominal incision  Last BM:  No BM since admission; last BM reported by Pt PTA 2/26  Height:   Ht Readings from Last 1 Encounters:  02/17/18 _2  (1.753 m)    Weight:   Wt Readings from Last 1 Encounters:  02/24/18 159 lb 1.6 oz (72.2 kg)    Ideal Body Weight:  72.7 kg  BMI:  Body mass index is 23.49 kg/m.  Estimated  Nutritional Needs:   Kcal:  1,850- 2,150 kcal/day  Protein:  92-106 grams  Fluid:  > 1.9 L/day  CaKoleen DistanceS, RD, LDN Pager #- [phone] Hours Pager: [phone]

## 2018-02-24 NOTE — Consult Note (Addendum)
PHARMACY - ADULT TOTAL PARENTERAL NUTRITION CONSULT NOTE   Pharmacy Consult for TPN Indication: Prolonged Ileus  Patient Measurements: Height: 5\' 9"  (175.3 cm) Weight: 159 lb 1.6 oz (72.2 kg) IBW/kg (Calculated) : 70.7 TPN AdjBW (KG): 70.5 Body mass index is 23.49 kg/m. Usual Weight:   Assessment:   GI:  Endo:  Insulin requirements in the past 24 hours: 0 Lytes: K = 4.4  3/7: Mg = 2.0, phos = 3.9   Renal: crcl 110.695ml/min Pulm: Cards:  Hepatobil: Neuro: ID: [id number] 3.375g q 8hr  TPN Access: PICC ordered 3/1 TPN start date: Nutritional Goals (per RD recommendation on ): kCal: Protein:  Fluid:  Goal TPN rate is 83 ml/hr (provides 2235 kcal/day, 100g/day protein, 2232ml volume)   Plan:  Patient is tolerating full liquid diet and passing gas.  Plan to reduce TPN rate today and possibly discharge home tomorrow per Dr. Everlene FarrierPabon. No additional electrolyte replacement needed today    Today will order Clinimix E 5/15 at 40 mL/hr. Lipids @ 25 ml/hr x 12 hr Add MVI, trace elements to TPN Sensitive SSI q 6 hr and adjust as needed.  IVF: NS @42  ml/hr currently.  F/U with  plan in AM.   Gardner CandleSheema M Tayten Heber, PharmD, BCPS Clinical Pharmacist 02/24/2018 9:43 AM

## 2018-02-24 NOTE — Progress Notes (Signed)
Doing well taknig clears AVSS  PE NAD Abd: soft, incision w erythema, mild tenderness JP serous  A/p Doing well We will monitor wound as he may developing an infection Wean off TPN Ambulate DC 24-48 hrs

## 2018-02-24 NOTE — Plan of Care (Signed)
Patient is ambulating in hallway and tolerating full liquid diet; passing gas.  Surgical site is red but clean and intact.  Mike Kim, Cathi Hazan N

## 2018-02-24 NOTE — Progress Notes (Signed)
We were called to see the patient concerning right foot numbness.  Patient is seen up and walking multiple laps around the nurses station with his IV pole on TPN.  Patient describes numbness on the "ball of his foot".  It is on the right foot only.  He has no left-sided symptoms.  He states that he had something like this twice before once in the late 80s and once in 1996 both of which surrounded times when he had a staph infection following a hip fracture and plate placement and plate removal.  He is points to his left hip as the site of his prior hip surgeries and staph infections and cannot remember which foot had the numbness.  But all of his prior surgeries and infections were on the left side.  Today his symptoms are on the right.  They started this morning and are improved but still present.  He describes them as tingling without pain and it has not inhibited his ability to walk.  Patient is examined first walking around the hallway without distress or change in gait.  He is then examined supine.  He has some trophic changes (he is a longtime smoker stopping the day of his admission this hospitalization).  He has full motor function of his foot and has very strong right DP and PT pulses his foot is warm and he has intact sensation to touch both on the plantar and ventral surface.  Etiology of patient's tingling of his bottom of his foot is unclear.  At this point it is improved over the acute onset early this morning.  It is not inhibiting his motor function or his ability to walk.  My recommendations would be for continued observation and potentially obtaining a podiatry consult if this does not improve.  There is no sign of vascular interruption in spite of him being a heavy smoker.

## 2018-02-25 LAB — GLUCOSE, CAPILLARY
GLUCOSE-CAPILLARY: 78 mg/dL (ref 65–99)
GLUCOSE-CAPILLARY: 73 mg/dL (ref 65–99)
GLUCOSE-CAPILLARY: 90 mg/dL (ref 65–99)
Glucose-Capillary: 102 mg/dL — ABNORMAL HIGH (ref 65–99)

## 2018-02-25 MED ORDER — GABAPENTIN 600 MG PO TABS
300.0000 mg | ORAL_TABLET | Freq: Three times a day (TID) | ORAL | Status: DC
  Administered 2018-02-25 – 2018-02-26 (×4): 300 mg via ORAL
  Filled 2018-02-25 (×4): qty 1

## 2018-02-25 MED ORDER — PANTOPRAZOLE SODIUM 40 MG PO TBEC
40.0000 mg | DELAYED_RELEASE_TABLET | Freq: Every day | ORAL | Status: DC
  Administered 2018-02-25: 40 mg via ORAL
  Filled 2018-02-25: qty 1

## 2018-02-25 MED ORDER — METRONIDAZOLE 500 MG PO TABS
500.0000 mg | ORAL_TABLET | Freq: Three times a day (TID) | ORAL | Status: DC
  Administered 2018-02-25 – 2018-02-26 (×5): 500 mg via ORAL
  Filled 2018-02-25 (×6): qty 1

## 2018-02-25 MED ORDER — CIPROFLOXACIN HCL 500 MG PO TABS
500.0000 mg | ORAL_TABLET | Freq: Two times a day (BID) | ORAL | Status: DC
  Administered 2018-02-25 – 2018-02-26 (×3): 500 mg via ORAL
  Filled 2018-02-25 (×3): qty 1

## 2018-02-25 NOTE — Progress Notes (Signed)
Notified Dr. Everlene FarrierPabon that it appears patient had been receiving  TPN at 3883ml/hr instead of 240ml/hr. Per MD okay to discontinue as pt is now on diet. RN will not hang a new bag at this time.

## 2018-02-25 NOTE — Progress Notes (Signed)
Doing well  M,ain issue is wound I suspect he has a wound infection Taking po Minimal serous output from drains AVSS Ambulating  PE NAD Abd: soft, incision w increasing erythema and tenderness c/w wound infection. I removed half of the staples and fluid was drained c/w infected seroma. Fascia intact. Placed wet/dry.  Left JP removed  A/P doing well Wound infection transition to po flagyl and cipro We will keep him to monitor wound  Dc in am

## 2018-02-25 NOTE — Progress Notes (Signed)
Per Dr. Everlene FarrierPabon discontinue order for CBG q 6 as pt no longer has TPN.

## 2018-02-25 NOTE — Progress Notes (Signed)
PHARMACIST - PHYSICIAN COMMUNICATION   CONCERNING: IV to Oral Route Change Policy  RECOMMENDATION: This patient is receiving PROTONIX by the intravenous route.  Based on criteria approved by the Pharmacy and Therapeutics Committee, the intravenous medication(s) is/are being converted to the equivalent oral dose form(s).   DESCRIPTION: These criteria include:  The patient is eating (either orally or via tube) and/or has been taking other orally administered medications for a least 24 hours  The patient has no evidence of active gastrointestinal bleeding or impaired GI absorption (gastrectomy, short bowel, patient on TNA or NPO).  If you have questions about this conversion, please contact the Pharmacy Department  []   [phone]( [phone] )  [facility] [x]   [phone]( [phone] )  [facility] []   [phone]( [phone] )  [facility] []   [phone]( [phone] )  [facility] []   [phone]( [phone] )  [facility]   Tacarra Justo A, [facility]PH 02/25/2018 3:05 PM

## 2018-02-26 MED ORDER — METRONIDAZOLE 500 MG PO TABS: 500.0000 mg | ORAL_TABLET | Freq: Three times a day (TID) | ORAL | 0 refills | Status: DC

## 2018-02-26 MED ORDER — HYDROCODONE-ACETAMINOPHEN 5-325 MG PO TABS: 1.0000 | ORAL_TABLET | ORAL | 0 refills | Status: DC | PRN

## 2018-02-26 MED ORDER — CIPROFLOXACIN HCL 500 MG PO TABS: 500.0000 mg | ORAL_TABLET | Freq: Two times a day (BID) | ORAL | 0 refills | Status: DC

## 2018-02-26 NOTE — Plan of Care (Signed)
Patient ambulated over 3000 feet in hallway.  Tolerating regular diet.  Dressing is dry and intact.  Mike Kim, [name] N

## 2018-02-26 NOTE — Progress Notes (Signed)
Patient discharge teaching given, including activity, diet, follow-up appoints, and medications. Patient verbalized understanding of all discharge instructions. PICC access was d/c'd per order. Vitals are stable. Skin is intact except as charted in most recent assessments. RN demonstrated wound dressing and when dressing needs to be done at home, pt demonstrated understanding with the teach back method. Father was also at the bedside when RN was going over the wound dressing, he verbalized understanding. Multiple wound dressing supplies were given to pt to help supply him for multiple days. Pt refused to be escorted out, to be driven home by family.  Marijayne Rauth [place]

## 2018-02-26 NOTE — Care Management Note (Addendum)
Case Management Note  Patient Details  Name: Mike EwingSteven D Human MRN: [id number] Date of Birth: Mar 14, 1966  Subjective/Objective:     Mr Coviello's PCP is [facility] physician. Discussed home health services with Mr Lyn Hollingsheadlexander. [facility] is in network with his insurance and a referral was called to Att[facility]Jermaine at [facility] for a HH=RN to teach family how to do daily wet to dry dressings. Per Vaughan BastaJermaine,  a RN will see Mr Lyn Hollingsheadlexander tomorrow to provide his first at home dressing change.                Action/Plan:   Expected Discharge Date:  02/26/18               Expected Discharge Plan:  Home w Home Health Services  In-House Referral:     Discharge planning Services  CM Consult  Post Acute Care Choice:  Home Health Choice offered to:  Patient  DME Arranged:    DME Agency:     HH Arranged:  RN HH Agency:  [facility]  Status of Service:  Completed, signed off  If discussed at Long Length of Stay Meetings, dates discussed:    Additional Comments:  Laurencia Roma A, RN 02/26/2018, 12:20 PM

## 2018-02-26 NOTE — Discharge Summary (Signed)
Patient ID: Mike EwingSteven D Kim MRN: [id number] DOB/AGE: 05-12-1966 52 y.o.  Admit date: 02/16/2018 Discharge date: 02/26/2018   Discharge Diagnoses:  Active Problems:   SBO (small bowel obstruction) (HCC)   Procedures: laparotomy, lysis of adhesions and bowel resection by Dr. Aleen CampiPiscoya 2/28  Hospital Course: 52 year old male with previous abdominal operations presented with a bowel obstruction and failed medical therapy.  He was taken to the operating room by Dr. Aleen CampiPiscoya who encounter a very difficult case with long operative time, thick adhesions and enterotomies.  Patient required a bowel resection and repair of enterotomy.  He developed an ileus postoperatively and an NG tube was placed.  He was also placed on TPN and broad-spectrum antibiotics.  He developed a wound infection and we had to remove the staples.  The patient continue on antibiotics and his ileus resolved.  At the time of discharge he was ambulating, tolerating regular diet.  Having bowel movements.  His vital signs were stable he is afebrile.  His physical exam showed a male in no acute distress.  Awake and alert.  Abdomen: Soft open wound healing well the fascia is intact.  Drain with serous fluid.  No peritonitis.  Extremities: Well perfused and no edema.  Condition of patient at time of discharge was stable  Consults: hospitalist  Disposition: 01-Home or Self Care  Discharge Instructions    Call MD for:  difficulty breathing, headache or visual disturbances   Complete by:  As directed    Call MD for:  extreme fatigue   Complete by:  As directed    Call MD for:  hives   Complete by:  As directed    Call MD for:  persistant dizziness or light-headedness   Complete by:  As directed    Call MD for:  persistant nausea and vomiting   Complete by:  As directed    Call MD for:  redness, tenderness, or signs of infection (pain, swelling, redness, odor or green/yellow discharge around incision site)   Complete by:  As  directed    Call MD for:  severe uncontrolled pain   Complete by:  As directed    Call MD for:  temperature >100.4   Complete by:  As directed    Change dressing (specify)   Complete by:  As directed    Dressing change: daily wet/dry open wound   Diet - low sodium heart healthy   Complete by:  As directed    Discharge instructions   Complete by:  As directed    Teach pt about jp care and daily wet/dry   Increase activity slowly   Complete by:  As directed      Allergies as of 02/26/2018   No Known Allergies     Medication List    TAKE these medications   ciprofloxacin 500 MG tablet Commonly known as:  CIPRO Take 1 tablet (500 mg total) by mouth 2 (two) times daily.   HYDROcodone-acetaminophen 5-325 MG tablet Commonly known as:  NORCO/VICODIN Take 1-2 tablets by mouth every 4 (four) hours as needed for moderate pain.   metroNIDAZOLE 500 MG tablet Commonly known as:  FLAGYL Take 1 tablet (500 mg total) by mouth every 8 (eight) hours.            Discharge Care Instructions  (From admission, onward)        Start     Ordered   02/26/18 0000  Change dressing (specify)    Comments:  Dressing change: daily wet/dry  open wound   02/26/18 0834     Follow-up Information    Pabon, [place] F, MD Follow up in 1 week(s).   Specialty:  General Surgery Contact information: [address] [place] [address] [phone]            Sterling Big, MD FACS

## 2018-02-28 ENCOUNTER — Telehealth: Payer: Self-pay | Admitting: General Practice

## 2018-02-28 NOTE — Telephone Encounter (Signed)
Amy Lennice Sitesope is calling with [facility], they admitted the patient yesterday for wound care and they need approval to go to the patients home two times a week for 1 week and  One  time a week for three weeks she can be reached at [phone]. Please call and advise.

## 2018-02-28 NOTE — Telephone Encounter (Signed)
Left message for Mike Kim to call office at this time.

## 2018-02-28 NOTE — Telephone Encounter (Signed)
FMLA paperwork [facility] Fax # [phone]. Patient notified and placed in scan folder. Also copy was made for patients father.

## 2018-02-28 NOTE — Telephone Encounter (Signed)
Amy Lennice Sitesope is calling with [facility], they admitted the patient yesterday for wound care and they need approval to go to the patients home two times a week for 1 week and  One  time a week for three weeks she can be reached at [phone]. Please call and advise.   I saw where you put about the fmla paperwork, but nothing about talking with Amy Lennice Sitesope has this been taken care of incase Amy calls back.

## 2018-03-02 NOTE — Telephone Encounter (Signed)
Spoke with Solon PalmAmy Pope at this time and vernal order was provided for wound care.

## 2018-03-04 ENCOUNTER — Ambulatory Visit (INDEPENDENT_AMBULATORY_CARE_PROVIDER_SITE_OTHER): Payer: BLUE CROSS/BLUE SHIELD | Admitting: Surgery

## 2018-03-04 ENCOUNTER — Encounter: Payer: Self-pay | Admitting: Surgery

## 2018-03-04 VITALS — BP 117/78 | HR 90 | Temp 97.8°F | Ht 66.0 in | Wt 154.0 lb

## 2018-03-04 DIAGNOSIS — Z09 Encounter for follow-up examination after completed treatment for conditions other than malignant neoplasm: Secondary | ICD-10-CM

## 2018-03-04 MED ORDER — CYCLOBENZAPRINE HCL 5 MG PO TABS: 5.0000 mg | ORAL_TABLET | Freq: Three times a day (TID) | ORAL | 0 refills | Status: DC | PRN

## 2018-03-04 MED ORDER — GABAPENTIN 300 MG PO CAPS: 300.0000 mg | ORAL_CAPSULE | Freq: Three times a day (TID) | ORAL | 0 refills | Status: DC

## 2018-03-04 MED ORDER — HYDROCODONE-ACETAMINOPHEN 5-325 MG PO TABS: 1.0000 | ORAL_TABLET | Freq: Four times a day (QID) | ORAL | 0 refills | Status: DC | PRN

## 2018-03-04 NOTE — Patient Instructions (Signed)
Please continue to pack the wound daily as instructed (wet to dry).  Please give us a call in case you have any questions or concerns.  We will see you back in a week to make sure that yo are doing well.

## 2018-03-07 ENCOUNTER — Encounter: Payer: Self-pay | Admitting: Surgery

## 2018-03-07 NOTE — Progress Notes (Signed)
S/p laparotomy and wound infection Doing well Taking Po + bm Minimal serous fluid from JP  PE NAD Abd: soft, wound healing well by secondary intention. Dressing changed. Drain removed  A/P doing well Recheck wound next week by Dr. Demetrius CharityP No surgical issues Refill pain med once only

## 2018-03-08 ENCOUNTER — Ambulatory Visit (INDEPENDENT_AMBULATORY_CARE_PROVIDER_SITE_OTHER): Payer: BLUE CROSS/BLUE SHIELD | Admitting: Surgery

## 2018-03-08 ENCOUNTER — Telehealth: Payer: Self-pay

## 2018-03-08 ENCOUNTER — Encounter: Payer: Self-pay | Admitting: Surgery

## 2018-03-08 VITALS — BP 130/76 | HR 99 | Temp 98.3°F | Wt 162.0 lb

## 2018-03-08 DIAGNOSIS — Z09 Encounter for follow-up examination after completed treatment for conditions other than malignant neoplasm: Secondary | ICD-10-CM

## 2018-03-08 NOTE — Telephone Encounter (Signed)
Patient's disability [facility] A copy was mailed to the patient.

## 2018-03-08 NOTE — Patient Instructions (Signed)
Please continue to change your dressing once a day (wet to dry).  We will see you back in 2 weeks.  We will update your disability form today.

## 2018-03-08 NOTE — Progress Notes (Signed)
03/08/2018  HPI: Patient is s/p exploratory laparotomy with extensive lysis of adhesions and small bowel resection on 2/28.  During his hospitalization, he had a midline wound infection and it had been opened.  He has been doing dressing changes twice daily and completed a course of antibiotics for the wound infection.  He has been doing well and is very happy with his results.  He is tolerating a regular diet, having normal bowel movements twice a day, and without any significant abdominal discomfort.  Vital signs: BP 130/76   Pulse 99   Temp 98.3 F (36.8 C) (Oral)   Wt 73.5 kg (162 lb)   BMI 26.15 kg/m    Physical Exam: Constitutional: No acute distress Abdomen:  Soft, non-distended, non-tender to palpation.  Midline incision is clean, dry, with an area open measuring about 7 cm length by 1 cm width.  The wound bed is healthy with good granulation tissue. On the superior two-thirds of the wound, there is a deeper cavity which does not track, with depth to the fascia.  Upon probing with q-tip, fascia is intact.  Wound was dressed with wet to dry, with partial packing of the cavity and then gauze over the rest of the wound.  There is some erythema at the skin consistent with rash from tape and dressing changes.  Assessment/Plan: 52 yo male s/p exploratory laparotomy, extensive lysis of adhesions, and small bowel resection.  --Instructed patient and his father on how to do wet to dry dressing changes with partial packing of the cavity and then gauze for the rest of the wound.  He can do this once daily now. --No further antibiotics needed --He will follow up in two weeks to check his wound healing progress.  Discussed with patient that likely the wound would take about a month to fully heal, but he could potentially go back to work sooner depending on how the healing is doing.  This will be assessed on his next visit.   Howie IllJose Luis Jaidynn Balster, MD [facility]

## 2018-03-14 ENCOUNTER — Inpatient Hospital Stay: Payer: BLUE CROSS/BLUE SHIELD | Admitting: Surgery

## 2018-03-21 ENCOUNTER — Telehealth: Payer: Self-pay

## 2018-03-21 ENCOUNTER — Encounter: Payer: Self-pay | Admitting: Surgery

## 2018-03-21 ENCOUNTER — Ambulatory Visit (INDEPENDENT_AMBULATORY_CARE_PROVIDER_SITE_OTHER): Payer: BLUE CROSS/BLUE SHIELD | Admitting: Surgery

## 2018-03-21 VITALS — BP 107/74 | HR 96 | Temp 98.7°F | Wt 152.0 lb

## 2018-03-21 DIAGNOSIS — K5652 Intestinal adhesions [bands] with complete obstruction: Secondary | ICD-10-CM

## 2018-03-21 DIAGNOSIS — K56609 Unspecified intestinal obstruction, unspecified as to partial versus complete obstruction: Secondary | ICD-10-CM

## 2018-03-21 DIAGNOSIS — Z4889 Encounter for other specified surgical aftercare: Secondary | ICD-10-CM

## 2018-03-21 NOTE — Telephone Encounter (Signed)
Patient's disability form was [facility].

## 2018-03-21 NOTE — Progress Notes (Signed)
Surgical Clinic Progress/Follow-up Note   HPI:  52 y.o. Male presents to clinic for post-op follow-up wound evaluation s/p exploratory laparotomy with lysis of post-surgical adhesions for small bowel obstruction not resolving with non-operative management. Patient reports he has been able to eat and drink without difficulty and has been +flatus and +BM's WNL. He describes his father (also present during appointment) was changing patient's packing/dressings, but there appears to be nothing left to pack. He otherwise denies abdominal pain, N/V, fever/chills, CP, or SOB and asks if/when he can return to work.  Review of Systems:  Constitutional: denies any other weight loss, fever, chills, or sweats  Eyes: denies any other vision changes, history of eye injury  ENT: denies sore throat, hearing problems  Respiratory: denies shortness of breath, wheezing  Cardiovascular: denies chest pain, palpitations  Gastrointestinal: abdominal pain, N/V, and bowel function as per HPI Musculoskeletal: denies any other joint pains or cramps  Skin: Denies any other rashes or skin discolorations  Neurological: denies any other headache, dizziness, weakness  Psychiatric: denies any other depression, anxiety  All other review of systems: otherwise negative   Vital Signs:  BP 107/74   Pulse 96   Temp 98.7 F (37.1 C) (Oral)   Wt 152 lb (68.9 kg)   BMI 24.53 kg/m    Physical Exam:  Constitutional:  -- Normal body habitus  -- Awake, alert, and oriented x3  Eyes:  -- Pupils equally round and reactive to light  -- No scleral icterus  Ear, nose, throat:  -- No jugular venous distension  -- No nasal drainage, bleeding Pulmonary:  -- No crackles -- Equal breath sounds bilaterally -- Breathing non-labored at rest Cardiovascular:  -- S1, S2 present  -- No pericardial rubs  Gastrointestinal:  -- Soft, nontender, non-distended, no guarding/rebound -- Incision well-approximated without surrounding  erythema or drainage, minimal remaining evidence of lower midline wound infection/disruption -- No abdominal masses appreciated, pulsatile or otherwise  Musculoskeletal / Integumentary:  -- Wounds or skin discoloration: None appreciated except as described above  -- Extremities: B/L UE and LE FROM, hands and feet warm, no edema  Neurologic:  -- Motor function: intact and symmetric  -- Sensation: intact and symmetric   Assessment:  52 y.o. yo Male with a problem list including...  Patient Active Problem List   Diagnosis Date Noted  . SBO (small bowel obstruction) (HCC) 02/16/2018  . Intestinal adhesions with complete obstruction (HCC)   . Small bowel obstruction (HCC) 01/29/2018    presents to clinic for follow-up wound evaluation, doing well just over 4 weeks s/p exploratory laparotomy with lysis of adhesions for small bowel obstruction not resolving despite non-operative management with nasogastric decompression.  Plan:   - no wound requiring further packing, may apply dry gauze prn to prevent rubbing  - okay to shower, though avoid submerging wound under water (baths/swimming) until completely healed  - okay to gradually resume activities and work without restrictions over next 2 weeks  - return to clinic as needed, instructed to call office if any questions or concerns  All of the above recommendations were discussed with the patient and his father, and all of patient's and family's questions were answered to their expressed satisfaction.  -- Scherrie GerlachJason E. Earlene Plateravis, MD, RPVI [place]: [facility] General Surgery - Partnering for exceptional care. Office: [phone]

## 2018-03-21 NOTE — Patient Instructions (Addendum)
At this time you are able to take a shower but remember not to submerge in a pool or tub for another week or until your wound is completely healed.  Please give us a call in case you have any questions or concerns.

## 2018-03-23 ENCOUNTER — Telehealth: Payer: Self-pay | Admitting: General Practice

## 2018-03-23 NOTE — Telephone Encounter (Signed)
Mike Kim with [employer] calling in regards to patient and has some questions regarding patients return to work date and if patient is on any restrictions. Mike Kim can be reached at [phone] ext [phone]. Please call and advise.

## 2018-03-23 NOTE — Telephone Encounter (Signed)
Called Lequita HaltMorgan from [employer] at [phone]. I had to leave her a detailed message since she was not available to speak with. I left her the information of patient being able to return to work on 03/31/2018 with no restrictions.  This information was faxed on 03/21/2018.

## 2019-03-26 IMAGING — CT CT ABD-PELV W/ CM
2 of 5 series · 15 of 46 positions shown, 17 images · IV contrast (APPLIED)
Comparison: Abdominal CT dated 01/29/2018

CLINICAL DATA: 51-year-old male with nausea vomiting. History of
bowel obstruction and prior exploratory laparotomy.

EXAM:
CT ABDOMEN AND PELVIS WITH CONTRAST
TECHNIQUE: Multidetector CT imaging of the abdomen and pelvis was performed
using the standard protocol following bolus administration of
intravenous contrast.
CONTRAST:  100mL NJLYAM-X77 IOPAMIDOL (NJLYAM-X77) INJECTION 61%

[Series 2: routine abd/pel with · axial · 0.72mm/px · z∈[-1064,-624]mm · 12 of 98 slices shown, 14 images]
[im 5/98  soft-tissue]
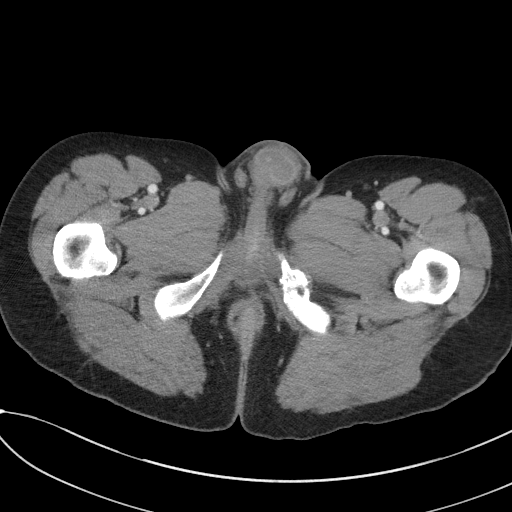
[im 5/98  bone]
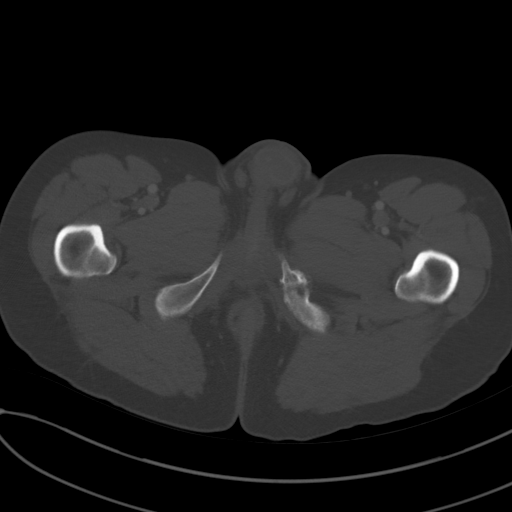
[im 15/98  soft-tissue]
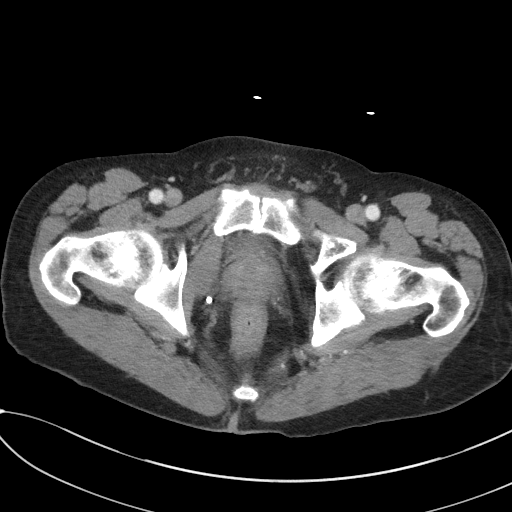
[im 20/98  soft-tissue]
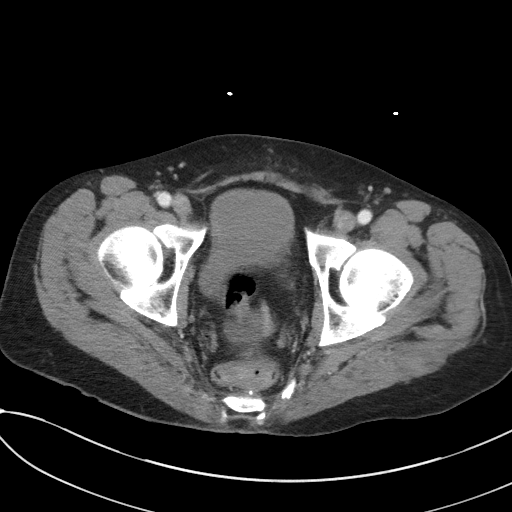
[im 30/98  soft-tissue]
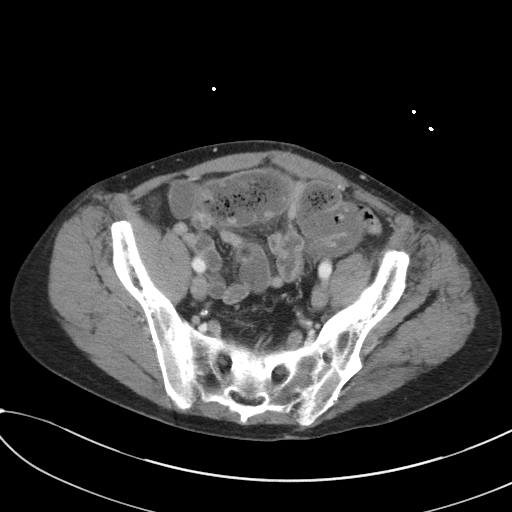
[im 39/98  soft-tissue]
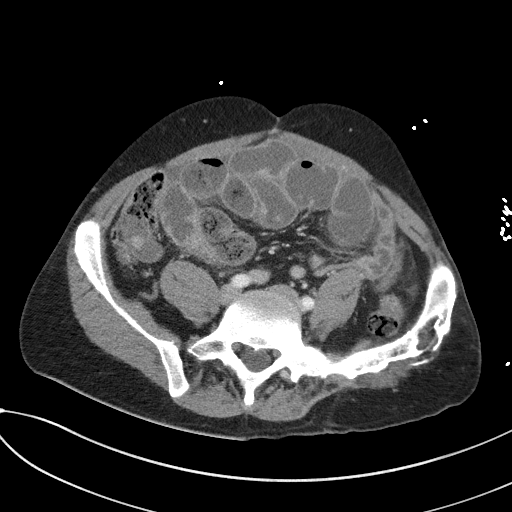
[im 44/98  soft-tissue]
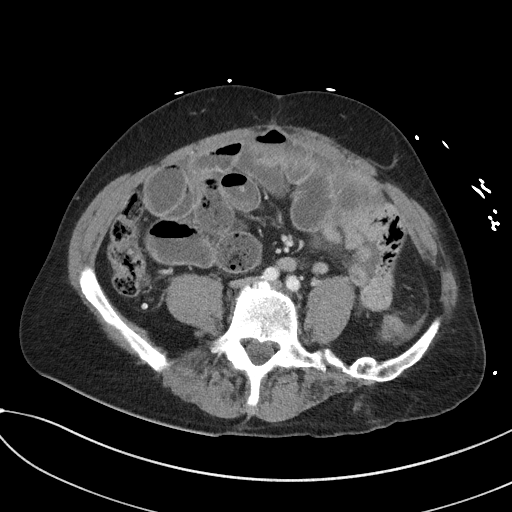
[im 54/98  soft-tissue]
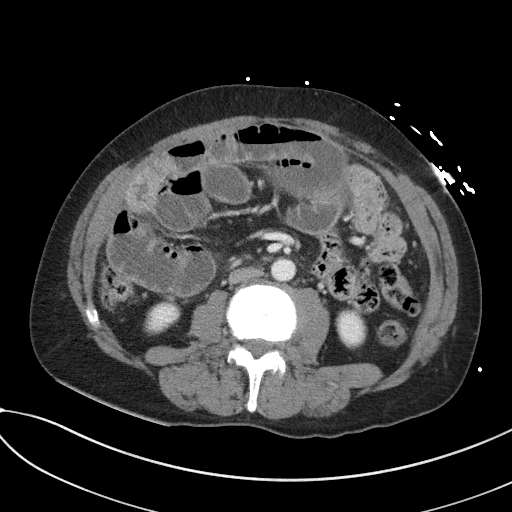
[im 59/98  soft-tissue]
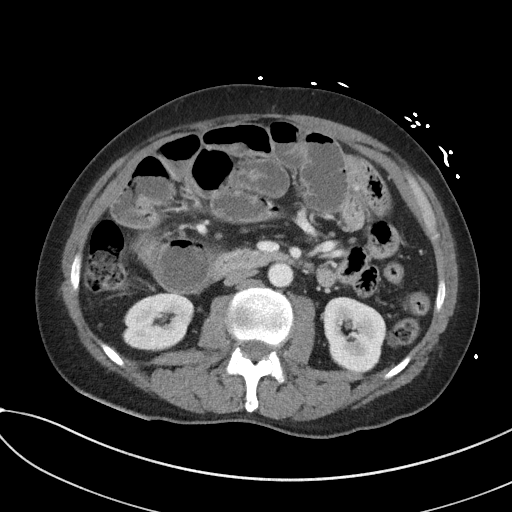
[im 68/98  soft-tissue]
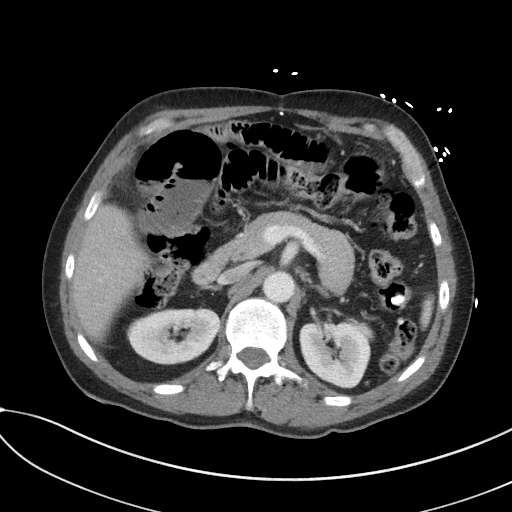
[im 68/98  bone]
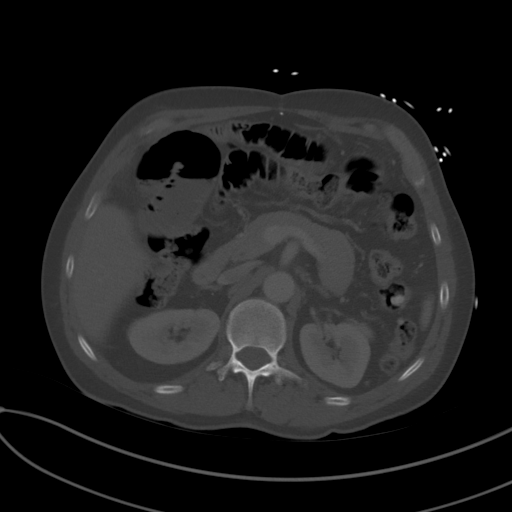
[im 78/98  soft-tissue]
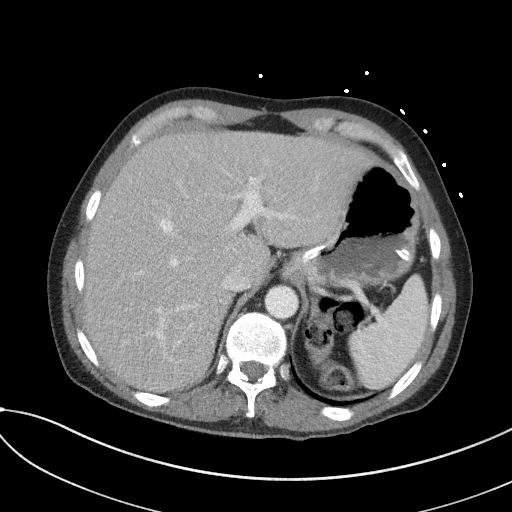
[im 83/98  soft-tissue]
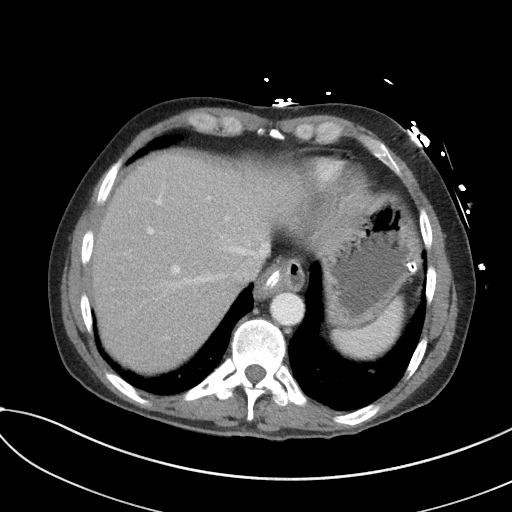
[im 93/98  soft-tissue]
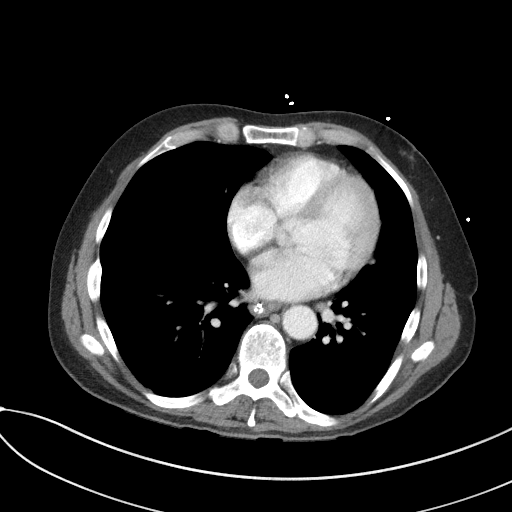

[Series 5: coronal st · coronal · 0.73mm/px · 3 of 84 slices shown]
[im 28/84  soft-tissue]
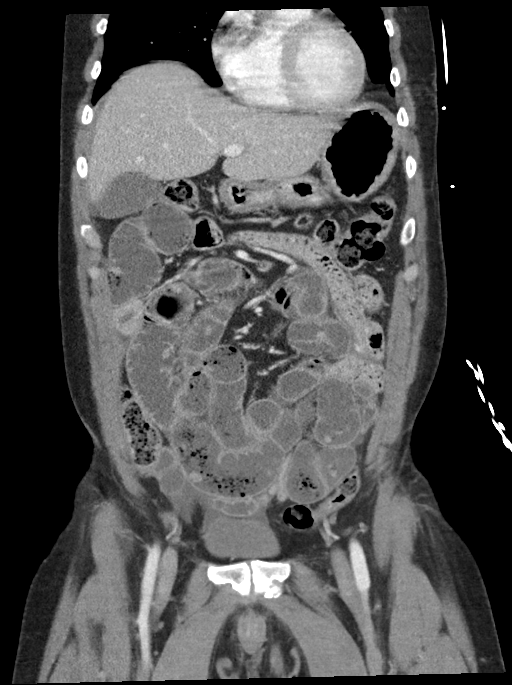
[im 37/84  soft-tissue]
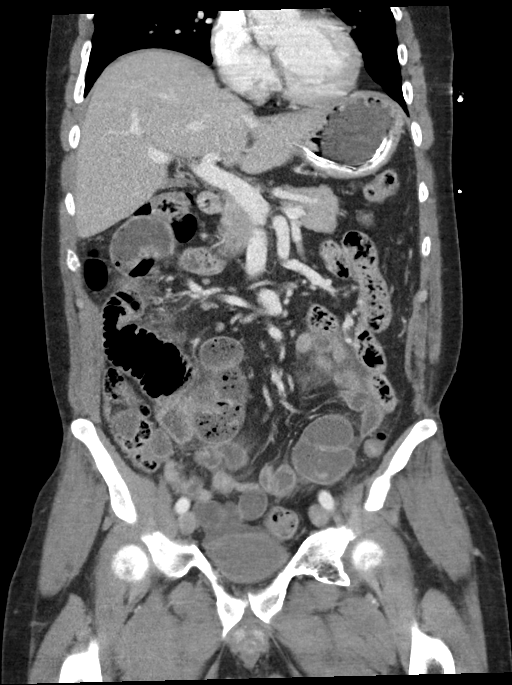
[im 47/84  soft-tissue]
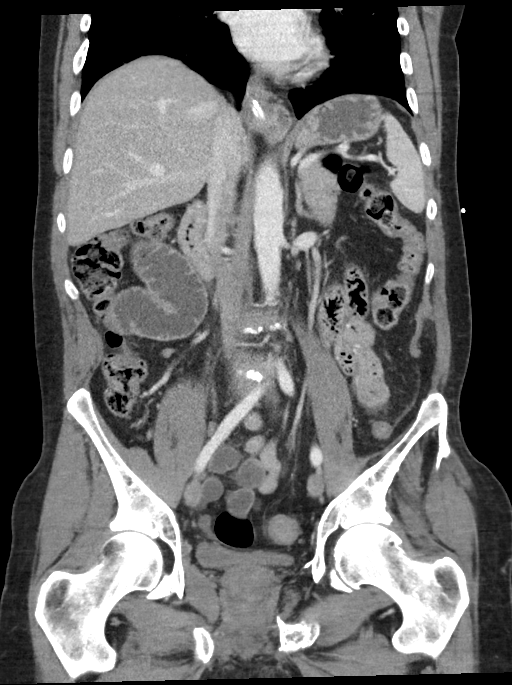

[15 of 46 positions shown; findings below may reference images not displayed]

FINDINGS: Lower chest: Clusters of nodular density in the lower lobes
bilaterally, new compared to the prior CT most likely infectious or
inflammatory. Aspiration is not excluded. Clinical correlation is
recommended.

No intra-abdominal free air. Small amount of fluid within the pelvis
and adjacent to the liver.

Hepatobiliary: No focal liver abnormality is seen. No gallstones,
gallbladder wall thickening, or biliary dilatation.

Pancreas: Unremarkable. No pancreatic ductal dilatation or
surrounding inflammatory changes.

Spleen: Normal in size without focal abnormality.

Adrenals/Urinary Tract: Adrenal glands are unremarkable. Kidneys are
normal, without renal calculi, focal lesion, or hydronephrosis.
Bladder is unremarkable.

Stomach/Bowel: An enteric tube is seen with tip in the gastric
fundus. There is a small hiatal hernia. There is dilatation of
multiple loops of small bowel in the proximal and mid abdomen
measuring up to 3.3 cm in diameter. The distal small bowel
demonstrate a normal caliber. A transition zone is seen in the
anterior lower pelvis (series 2, image 68 and coronal series 5 image
25). There is abutment of loops of small bowel to the anterior
peritoneal wall and rectus sheath most consistent with adhesions.
There is mild associated inflammatory changes of the bowel loops in
the left lower abdomen. The colon is unremarkable. An air-filled
diverticula or prominent colonic fold arising from the sigmoid colon
corresponds to the location of the previously seen ovoid lesion. The
appendix is normal.

Vascular/Lymphatic: The abdominal aorta and IVC appear unremarkable.
No portal venous gas. There is no adenopathy.

Reproductive: The prostate and seminal vesicles are grossly
unremarkable.

Other: Midline vertical anterior abdominal wall incisional scar.

Musculoskeletal: Old healed fracture of the left pelvic bone with
partial ankylosis of the left SI joint. No acute osseous pathology.
IMPRESSION: Small-bowel obstruction secondary to adhesions with transition in
the left anterior lower pelvis posterior to the left rectus sheath.
Overall there is progression of the obstruction compared to the
prior CT of 01/29/2018.

## 2019-03-26 IMAGING — DX DG ABD PORTABLE 1V
1 series · 1 of 1 positions shown · non-contrast
Comparison: 02/03/2018 abdomen radiographs.

CLINICAL DATA: 51 y/o  M; enteric tube placement.

EXAM:
PORTABLE ABDOMEN - 1 VIEW

[abdomen kub]
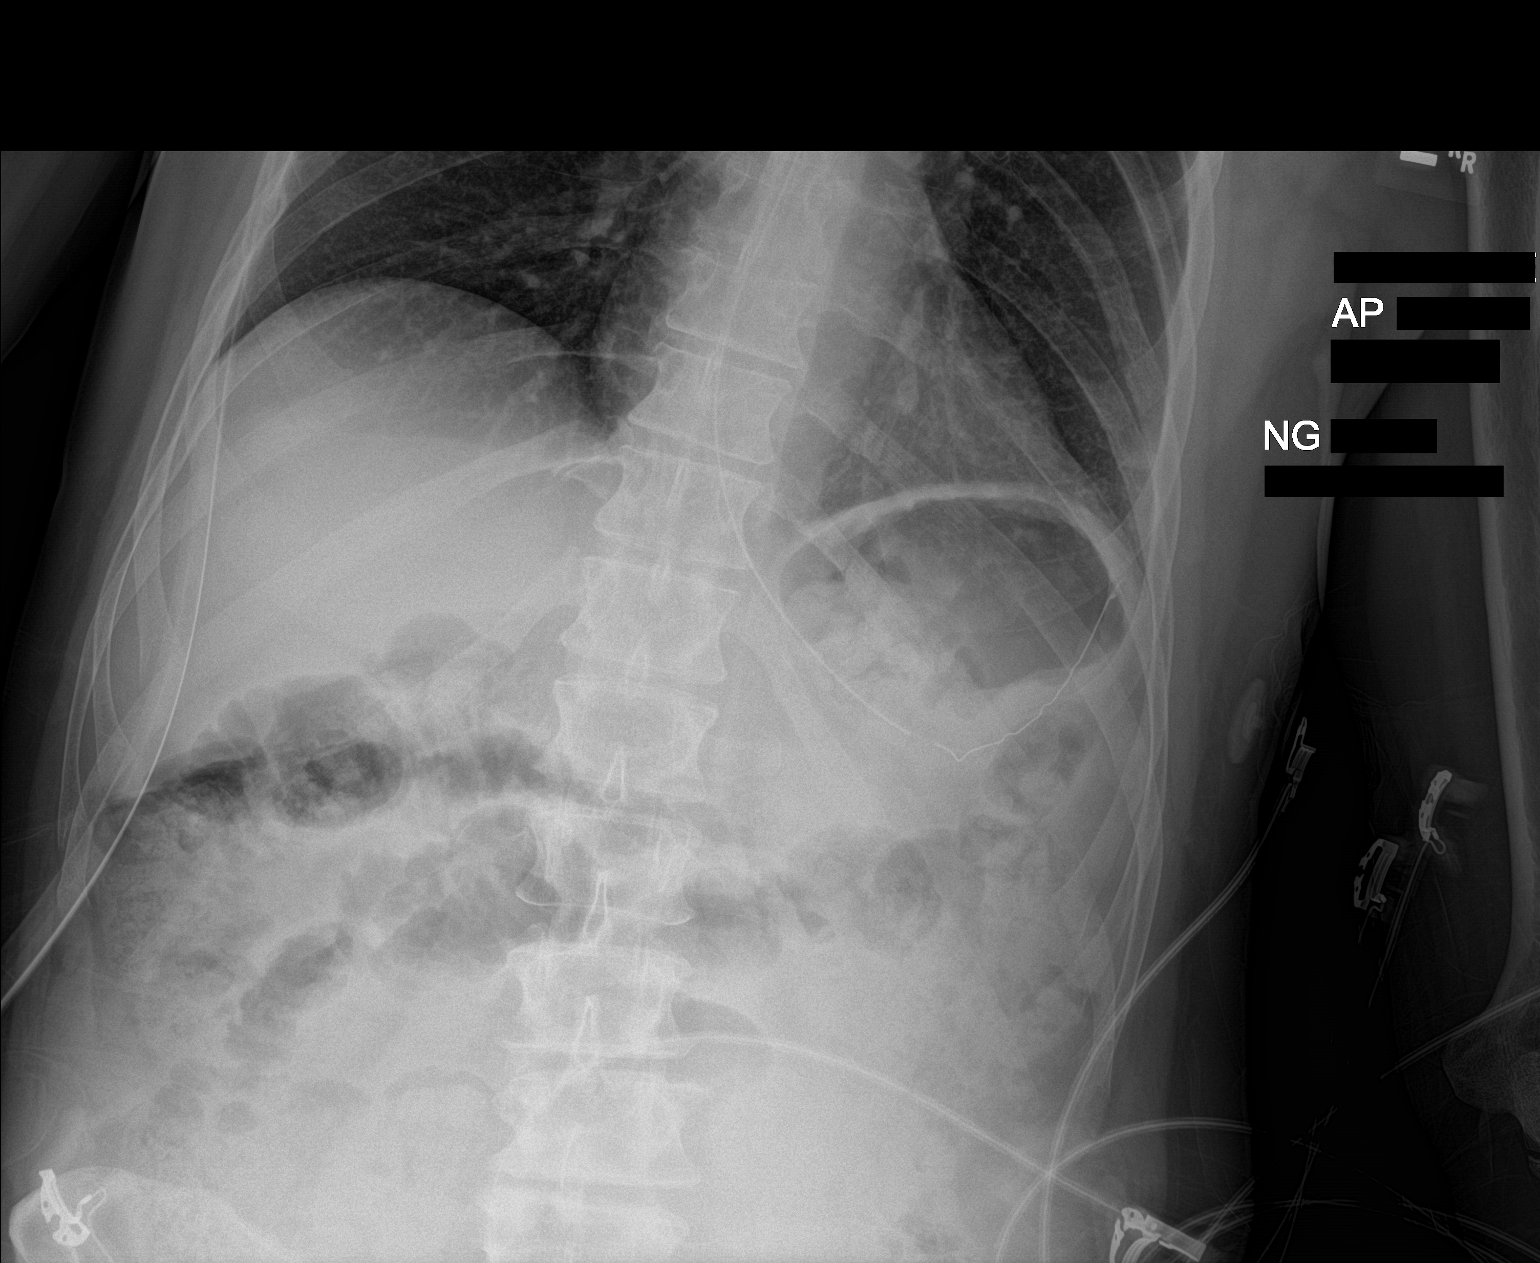

[1 of 1 positions shown; findings below may reference images not displayed]

FINDINGS: Normal bowel gas pattern. Enteric tube tip projects over gastric
body. Bones are unremarkable.
IMPRESSION: Enteric tube tip projects over gastric body.

By: Elvis Melisa Mandir M.D.

## 2021-10-18 ENCOUNTER — Other Ambulatory Visit: Payer: Self-pay

## 2021-10-18 ENCOUNTER — Emergency Department
Admission: EM | Admit: 2021-10-18 | Discharge: 2021-10-18 | Disposition: A | Discharge status: Home / Self Care | Payer: BLUE CROSS/BLUE SHIELD | Attending: Emergency Medicine | Admitting: Emergency Medicine

## 2021-10-18 ENCOUNTER — Encounter: Payer: Self-pay | Admitting: Intensive Care

## 2021-10-18 DIAGNOSIS — Y9301 Activity, walking, marching and hiking: Secondary | ICD-10-CM | POA: Insufficient documentation

## 2021-10-18 DIAGNOSIS — Z5321 Procedure and treatment not carried out due to patient leaving prior to being seen by health care provider: Secondary | ICD-10-CM | POA: Insufficient documentation

## 2021-10-18 DIAGNOSIS — W0110XA Fall on same level from slipping, tripping and stumbling with subsequent striking against unspecified object, initial encounter: Secondary | ICD-10-CM | POA: Insufficient documentation

## 2021-10-18 DIAGNOSIS — F10129 Alcohol abuse with intoxication, unspecified: Secondary | ICD-10-CM | POA: Insufficient documentation

## 2021-10-18 DIAGNOSIS — S01119A Laceration without foreign body of unspecified eyelid and periocular area, initial encounter: Secondary | ICD-10-CM | POA: Insufficient documentation

## 2021-10-18 HISTORY — DX: Unspecified convulsions: R56.9

## 2021-10-18 HISTORY — DX: Alcohol abuse, uncomplicated: F10.10

## 2021-10-18 LAB — CBC WITH DIFFERENTIAL/PLATELET
Abs Immature Granulocytes: 0.05 10*3/uL (ref 0.00–0.07)
Basophils Absolute: 0 10*3/uL (ref 0.0–0.1)
Basophils Relative: 1 %
Eosinophils Absolute: 0.1 10*3/uL (ref 0.0–0.5)
Eosinophils Relative: 2 %
HCT: 45.7 % (ref 39.0–52.0)
Hemoglobin: 15.7 g/dL (ref 13.0–17.0)
Immature Granulocytes: 1 %
Lymphocytes Relative: 34 %
Lymphs Abs: 1.9 10*3/uL (ref 0.7–4.0)
MCH: 31.7 pg (ref 26.0–34.0)
MCHC: 34.4 g/dL (ref 30.0–36.0)
MCV: 92.3 fL (ref 80.0–100.0)
Monocytes Absolute: 0.4 10*3/uL (ref 0.1–1.0)
Monocytes Relative: 7 %
Neutro Abs: 3.1 10*3/uL (ref 1.7–7.7)
Neutrophils Relative %: 55 %
Platelets: 285 10*3/uL (ref 150–400)
RBC: 4.95 MIL/uL (ref 4.22–5.81)
RDW: 12.8 % (ref 11.5–15.5)
WBC: 5.6 10*3/uL (ref 4.0–10.5)
nRBC: 0 % (ref 0.0–0.2)

## 2021-10-18 LAB — COMPREHENSIVE METABOLIC PANEL
ALT: 19 U/L (ref 0–44)
AST: 25 U/L (ref 15–41)
Albumin: 4.6 g/dL (ref 3.5–5.0)
Alkaline Phosphatase: 59 U/L (ref 38–126)
Anion gap: 9 (ref 5–15)
BUN: 14 mg/dL (ref 6–20)
CO2: 21 mmol/L — ABNORMAL LOW (ref 22–32)
Calcium: 9.4 mg/dL (ref 8.9–10.3)
Chloride: 109 mmol/L (ref 98–111)
Creatinine, Ser: 0.78 mg/dL (ref 0.61–1.24)
GFR, Estimated: >60 mL/min (ref >60)
Glucose, Bld: 89 mg/dL (ref 70–99)
Potassium: 4 mmol/L (ref 3.5–5.1)
Sodium: 139 mmol/L (ref 135–145)
Total Bilirubin: 0.5 mg/dL (ref 0.3–1.2)
Total Protein: 8.5 g/dL — ABNORMAL HIGH (ref 6.5–8.1)

## 2021-10-18 LAB — ETHANOL: Alcohol, Ethyl (B): 123 mg/dL — ABNORMAL HIGH (ref <10)
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
## 2021-10-18 NOTE — ED Triage Notes (Signed)
First nurse note: pt comes ems from home. Pt has etoh on board. Pt fell and hit his head. Lac to eyebrow. No LOC. VSS.

## 2021-10-18 NOTE — ED Notes (Signed)
Pt ambulatory in NAD and walked out of lobby. Asked if pt was okay and pt states "yes ma'am" and walks out. States that he has to walk home so he is leaving. Aox4, NAD, ambulatory without any issue.

## 2021-10-19 ENCOUNTER — Encounter: Payer: Self-pay | Admitting: Emergency Medicine

## 2021-10-19 ENCOUNTER — Emergency Department
Admission: EM | Admit: 2021-10-19 | Discharge: 2021-10-19 | Disposition: A | Discharge status: Home / Self Care | Payer: Self-pay | Attending: Student in an Organized Health Care Education/Training Program | Admitting: Student in an Organized Health Care Education/Training Program

## 2021-10-19 ENCOUNTER — Other Ambulatory Visit: Payer: Self-pay

## 2021-10-19 DIAGNOSIS — S0012XA Contusion of left eyelid and periocular area, initial encounter: Secondary | ICD-10-CM

## 2021-10-19 DIAGNOSIS — Y9289 Other specified places as the place of occurrence of the external cause: Secondary | ICD-10-CM | POA: Insufficient documentation

## 2021-10-19 DIAGNOSIS — W010XXA Fall on same level from slipping, tripping and stumbling without subsequent striking against object, initial encounter: Secondary | ICD-10-CM | POA: Insufficient documentation

## 2021-10-19 DIAGNOSIS — F17210 Nicotine dependence, cigarettes, uncomplicated: Secondary | ICD-10-CM | POA: Insufficient documentation

## 2021-10-19 DIAGNOSIS — S01112A Laceration without foreign body of left eyelid and periocular area, initial encounter: Secondary | ICD-10-CM | POA: Insufficient documentation

## 2021-10-19 MED ORDER — LIDOCAINE HCL (PF) 1 % IJ SOLN
5.0000 mL | Freq: Once | INTRAMUSCULAR | Status: AC
  Administered 2021-10-19: 5 mL via INTRADERMAL
  Filled 2021-10-19: qty 5

## 2021-10-19 NOTE — ED Triage Notes (Signed)
Pt reports tripped over a stool and fell cutting above his left eye. Denies LOC. Denies LOC
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
## 2021-10-19 NOTE — ED Notes (Signed)
Patient given discharge instructions, all questions answered. Patient in possession of all belongings, directed to the discharge area  

## 2022-11-30 DIAGNOSIS — Z23 Encounter for immunization: Secondary | ICD-10-CM | POA: Diagnosis not present

## 2022-11-30 DIAGNOSIS — Z0000 Encounter for general adult medical examination without abnormal findings: Secondary | ICD-10-CM | POA: Diagnosis not present

## 2022-11-30 DIAGNOSIS — F17210 Nicotine dependence, cigarettes, uncomplicated: Secondary | ICD-10-CM | POA: Diagnosis not present

## 2022-11-30 DIAGNOSIS — Z125 Encounter for screening for malignant neoplasm of prostate: Secondary | ICD-10-CM | POA: Diagnosis not present

## 2022-11-30 DIAGNOSIS — K921 Melena: Secondary | ICD-10-CM | POA: Diagnosis not present

## 2022-12-01 ENCOUNTER — Other Ambulatory Visit: Payer: Self-pay | Admitting: Family Medicine

## 2022-12-01 DIAGNOSIS — F17210 Nicotine dependence, cigarettes, uncomplicated: Secondary | ICD-10-CM

## 2022-12-01 DIAGNOSIS — Z0000 Encounter for general adult medical examination without abnormal findings: Secondary | ICD-10-CM

## 2022-12-22 DIAGNOSIS — D649 Anemia, unspecified: Secondary | ICD-10-CM | POA: Diagnosis not present

## 2022-12-22 DIAGNOSIS — R7989 Other specified abnormal findings of blood chemistry: Secondary | ICD-10-CM | POA: Diagnosis not present

## 2022-12-27 ENCOUNTER — Other Ambulatory Visit: Payer: Self-pay

## 2022-12-27 ENCOUNTER — Emergency Department: Payer: 59

## 2022-12-27 ENCOUNTER — Emergency Department
Admission: EM | Admit: 2022-12-27 | Discharge: 2022-12-27 | Disposition: A | Discharge status: Home / Self Care | Payer: 59 | Attending: Emergency Medicine | Admitting: Emergency Medicine

## 2022-12-27 DIAGNOSIS — M5441 Lumbago with sciatica, right side: Secondary | ICD-10-CM | POA: Diagnosis not present

## 2022-12-27 DIAGNOSIS — M48061 Spinal stenosis, lumbar region without neurogenic claudication: Secondary | ICD-10-CM | POA: Diagnosis not present

## 2022-12-27 DIAGNOSIS — G8929 Other chronic pain: Secondary | ICD-10-CM | POA: Diagnosis not present

## 2022-12-27 DIAGNOSIS — M545 Low back pain, unspecified: Secondary | ICD-10-CM | POA: Diagnosis not present

## 2022-12-27 MED ORDER — MELOXICAM 15 MG PO TABS: 15.0000 mg | ORAL_TABLET | Freq: Every day | ORAL | 11 refills | Status: AC

## 2022-12-27 MED ORDER — KETOROLAC TROMETHAMINE 15 MG/ML IJ SOLN
15.0000 mg | Freq: Once | INTRAMUSCULAR | Status: AC
  Administered 2022-12-27: 15 mg via INTRAMUSCULAR
  Filled 2022-12-27: qty 1

## 2022-12-27 MED ORDER — LIDOCAINE 5 % EX PTCH: 1.0000 | MEDICATED_PATCH | Freq: Two times a day (BID) | CUTANEOUS | 11 refills | Status: AC

## 2022-12-27 MED ORDER — CYCLOBENZAPRINE HCL 10 MG PO TABS: 10.0000 mg | ORAL_TABLET | Freq: Three times a day (TID) | ORAL | 0 refills | Status: AC | PRN
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
## 2022-12-27 NOTE — Discharge Instructions (Addendum)
-  Your x-ray not show any signs of fractures.  I suspect that you likely have a muscle strain in your lumbar region.  This may be putting pressure on your sciatic nerve, which is a source of the numbness/tingling that you feel in your right leg.  Please review the educational material and review the sciatica rehab exercises.  -You may take meloxicam as needed for pain.  You may additionally utilize lidocaine patches.  You may take the cyclobenzaprine for muscle relaxation, the use caution as it may make you dizzy/drowsy.  -If your symptoms fail to improve after 4 to 5 weeks, please follow-up with the orthopedist listed in these instructions.  -Return to the emergency department anytime if you begin to experience any new or worsening symptoms.
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
## 2022-12-29 ENCOUNTER — Telehealth: Payer: Self-pay | Admitting: *Deleted

## 2022-12-29 NOTE — Patient Outreach (Signed)
  Care Coordination [facility] Note Transition Care Management Unsuccessful Follow-up Telephone Call  Date of discharge and from where:  12/27/22 from [facility] ED  Attempts:  1st Attempt  Reason for unsuccessful TCM follow-up call:  Left voice message   Lurena Joiner RN, BSN [place] RN Care Coordinator

## 2023-02-16 DIAGNOSIS — R790 Abnormal level of blood mineral: Secondary | ICD-10-CM | POA: Diagnosis not present
